# Patient Record
Sex: Female | Born: 1950 | Race: Black or African American | Hispanic: No | Marital: Married | Smoking: Never smoker
Health system: Southern US, Community
[De-identification: ages and names within clinical notes are randomized; demographics above are authoritative.]

## PROBLEM LIST (undated history)

## (undated) DIAGNOSIS — Q282 Arteriovenous malformation of cerebral vessels: Secondary | ICD-10-CM

## (undated) DIAGNOSIS — T7840XA Allergy, unspecified, initial encounter: Secondary | ICD-10-CM

## (undated) DIAGNOSIS — J309 Allergic rhinitis, unspecified: Secondary | ICD-10-CM

## (undated) DIAGNOSIS — F329 Major depressive disorder, single episode, unspecified: Secondary | ICD-10-CM

## (undated) DIAGNOSIS — F32A Depression, unspecified: Secondary | ICD-10-CM

## (undated) DIAGNOSIS — I10 Essential (primary) hypertension: Secondary | ICD-10-CM

## (undated) HISTORY — DX: Major depressive disorder, single episode, unspecified: F32.9

## (undated) HISTORY — DX: Depression, unspecified: F32.A

## (undated) HISTORY — PX: ABDOMINAL HYSTERECTOMY: SHX81

## (undated) HISTORY — DX: Arteriovenous malformation of cerebral vessels: Q28.2

## (undated) HISTORY — PX: BRAIN AVM REPAIR: SHX202

## (undated) HISTORY — DX: Essential (primary) hypertension: I10

## (undated) HISTORY — DX: Allergy, unspecified, initial encounter: T78.40XA

---

## 2000-06-22 ENCOUNTER — Observation Stay (HOSPITAL_COMMUNITY): Admission: EM | Admit: 2000-06-22 | Discharge: 2000-06-23 | Payer: Self-pay | Admitting: Emergency Medicine

## 2000-06-22 ENCOUNTER — Encounter: Payer: Self-pay | Admitting: *Deleted

## 2002-06-20 ENCOUNTER — Inpatient Hospital Stay (HOSPITAL_COMMUNITY): Admission: AD | Admit: 2002-06-20 | Discharge: 2002-06-25 | Payer: Self-pay | Admitting: Family Medicine

## 2002-06-20 ENCOUNTER — Encounter: Payer: Self-pay | Admitting: Internal Medicine

## 2002-06-21 ENCOUNTER — Encounter: Payer: Self-pay | Admitting: Cardiology

## 2002-06-21 ENCOUNTER — Encounter: Payer: Self-pay | Admitting: Internal Medicine

## 2002-06-24 ENCOUNTER — Encounter: Payer: Self-pay | Admitting: Internal Medicine

## 2002-07-25 ENCOUNTER — Encounter: Admission: RE | Admit: 2002-07-25 | Discharge: 2002-10-23 | Payer: Self-pay | Admitting: Family Medicine

## 2002-12-16 ENCOUNTER — Ambulatory Visit (HOSPITAL_COMMUNITY): Admission: RE | Admit: 2002-12-16 | Discharge: 2002-12-16 | Payer: Self-pay | Admitting: Gastroenterology

## 2003-04-29 DIAGNOSIS — Q282 Arteriovenous malformation of cerebral vessels: Secondary | ICD-10-CM

## 2003-04-29 HISTORY — DX: Arteriovenous malformation of cerebral vessels: Q28.2

## 2004-03-12 ENCOUNTER — Ambulatory Visit: Payer: Self-pay | Admitting: Psychology

## 2004-03-12 ENCOUNTER — Encounter: Admission: RE | Admit: 2004-03-12 | Discharge: 2004-06-10 | Payer: Self-pay | Admitting: Neurosurgery

## 2005-07-31 ENCOUNTER — Encounter: Admission: RE | Admit: 2005-07-31 | Discharge: 2005-07-31 | Payer: Self-pay | Admitting: Neurosurgery

## 2006-08-29 ENCOUNTER — Inpatient Hospital Stay (HOSPITAL_COMMUNITY): Admission: EM | Admit: 2006-08-29 | Discharge: 2006-08-30 | Payer: Self-pay | Admitting: *Deleted

## 2008-01-27 ENCOUNTER — Ambulatory Visit (HOSPITAL_COMMUNITY): Payer: Self-pay | Admitting: Psychology

## 2008-02-10 ENCOUNTER — Ambulatory Visit (HOSPITAL_COMMUNITY): Payer: Self-pay | Admitting: Psychology

## 2008-02-29 ENCOUNTER — Ambulatory Visit (HOSPITAL_COMMUNITY): Payer: Self-pay | Admitting: Psychology

## 2009-10-01 ENCOUNTER — Ambulatory Visit: Payer: Self-pay | Admitting: Internal Medicine

## 2010-01-31 ENCOUNTER — Ambulatory Visit: Payer: Self-pay | Admitting: Internal Medicine

## 2010-05-19 ENCOUNTER — Encounter: Payer: Self-pay | Admitting: Neurosurgery

## 2010-07-15 ENCOUNTER — Ambulatory Visit (INDEPENDENT_AMBULATORY_CARE_PROVIDER_SITE_OTHER): Payer: Self-pay | Admitting: Internal Medicine

## 2010-07-15 ENCOUNTER — Ambulatory Visit: Payer: BC Managed Care – PPO | Admitting: Internal Medicine

## 2010-07-15 DIAGNOSIS — M545 Low back pain: Secondary | ICD-10-CM

## 2010-09-13 NOTE — Discharge Summary (Signed)
Kaiser, Melanie            ACCOUNT NO.:  [id number]   MEDICAL RECORD NO.:  [id number]          PATIENT TYPE:  INP   LOCATION:  1405                         FACILITY:  [facility]   PHYSICIAN:  Ladell Pier, M.D.   DATE OF BIRTH:  04/03/1951   DATE OF ADMISSION:  08/29/2006  DATE OF DISCHARGE:                               DISCHARGE SUMMARY   DISCHARGE DIAGNOSES:  1. Chest pain.  Negative cardiac enzymes, normal electrocardiogram.      The patient will be called by [facility] for appointment,      telephone number [phone].  2. Hypertension.  3. Anemia.  4. __________  5. __________ in the past.  6. Insomnia.  7  Hypokalemia.   DISCHARGE MEDICATIONS:  1. Atenolol 100 mg daily.  2. Maxzide 37.5/25 daily.  3. Amitriptyline 10 mg p.o. nightly p.r.n. insomnia.   FOLLOWUP APPOINTMENTS:  Followup with primary care physician in 1 week,  Dr. Wynonia Hazard.  The patient is to follow up with [facility]  [facility]iology, Dr. Lewayne Bunting, [phone].  The patient will be called by  [place] with appointment.   HISTORY OF PRESENT ILLNESS:  The patient is a 60 year old female who  presented to the emergency room secondary to complaint of substernal  chest pain radiating to the left shoulder.  The patient stated that the  pain awakens her out of her sleep.  She received aspirin in the  emergency department.  She denies nausea, vomiting, or dizziness.   PAST MEDICAL HISTORY, SOCIAL HISTORY, ALLERGIES, MEDICATIONS, REVIEW OF  SYSTEMS:  Refer to admission H&P.   PHYSICAL EXAMINATION ON DISCHARGE:  VITAL SIGNS: Temperature 97.9, pulse  68, respirations 20, blood pressure 115/67, pulse oximetry 98% on room  air.  HEENT:  Normocephalic and atraumatic.  Pupils equal, round, and reactive  to light.  Throat without erythema.  CARDIOVASCULAR:  Regular rate and rhythm.  LUNGS:  Clear bilaterally.  ABDOMEN:  Positive bowel sounds.  EXTREMITIES:  No edema.   HOSPITAL COURSE:  #1.  CHEST  PAIN:  The patient was admitted to the hospital and ruled out  with serial enzymes.  Her D-dimer, chest x-ray, and EKG were normal.  The patient discussed with [facility], and since the patient is  low risk and all her enzymes have been negative, she will follow up for  outpatient stress test.  She will be called by cardiology.   #2.  HYPERTENSION:  Blood pressure remained normal throughout her  hospitalization.  Continued her on medications.   #3.  ANEMIA:  The patient states that she does not remember having a  history of anemia.  She will follow up outpatient with Dr. Adonis Housekeeper for  further workup of anemia.   #4.  HYPOKALEMIA:  Most likely secondary to her diuretic.  We will  replete her potassium today, and she will get her potassium level  checked when she follows up with her primary care physician.   LABORATORY DATA:  WBC 5.9, hemoglobin 10.8, platelets 162.  BMP: Sodium  139, potassium 3.3, chloride 104, CO2 28, glucose 102, BUN 7, creatinine  0.8.  Homocysteine level  7.1.  Cardiac markers normal.  D-dimer less  than 0.22.   Chest x-ray:  No acute abnormality.      Ladell Pier, M.D.  Electronically Signed     NJ/MEDQ  D:  08/30/2006  T:  08/30/2006  Job:  [id number]   cc:   Duwayne Heck L. Mahaffey, M.D.  Fax: [phone]   Doylene Canning. Ladona Ridgel, MD  [address]  [address]  [place]  [place] [address]

## 2010-09-13 NOTE — H&P (Signed)
NAME:  HAYDE, KILGOUR            ACCOUNT NO.:  [id number]   MEDICAL RECORD NO.:  [id number]          PATIENT TYPE:  INP   LOCATION:  0104                         FACILITY:  [facility]   PHYSICIAN:  Della Goo, M.D. DATE OF BIRTH:  Dec 09, 1950   DATE OF ADMISSION:  08/29/2006  DATE OF DISCHARGE:                              HISTORY & PHYSICAL   PRIMARY CARE PHYSICIAN:  Unassigned.   CHIEF COMPLAINT:  Chest pain.   HISTORY OF PRESENT ILLNESS:  This is a 60 year old female who presented  to the emergency department secondary to complaints of intermittent  substernal chest pain which radiated into the left shoulder.  The  patient reports being awakened from sleeping in the a.m. due to the  pain.  It persisted and was unrelieved until administration of aspirin  therapy in the emergency department when she presented that evening.  She described the pain as intermittent an achy and rated the intensity  as a 2/10 at the worst.  She denies having any nausea, vomiting,  diaphoresis, syncope, or dizziness associated with the pain.  She also  states that the pain is not reproducible or palpable.   PAST MEDICAL HISTORY:  Hypertension.   PAST SURGICAL HISTORY:  1. Total abdominal hysterectomy.  2. Axillary sebaceous cyst removal.   ALLERGIES:  NO KNOWN DRUG ALLERGIES.   MEDICATIONS:  1. Atenolol 100 mg one p.o. daily.  2. Maxzide 37.5/25 one p.o. daily.  3. Amitriptyline 10 mg, one p.o. q.h.s. p.r.n. insomnia.   SOCIAL HISTORY:  The patient is married.  Nonsmoker, nondrinker.   FAMILY HISTORY:  Both parents had hypertension.  No history of coronary  artery disease, diabetes mellitus, or cancer in her family   REVIEW OF SYSTEMS:  Pertinents are mentioned above.   PHYSICAL EXAMINATION:  GENERAL:  This is a pleasant 60 year old well-  nourished, well-developed female in no acute distress.  VITAL SIGNS:  Temperature 98.1, blood pressure 138/84, heart rate 61,  respirations 20.  O2  saturations 98% on room air.  HEENT:  Normocephalic, atraumatic.  Pupils equally round and reactive to  light.  Extraocular muscles are intact.  Funduscopic benign.  Oropharynx  is clear.  NECK:  Supple, full range of motion.  No thyromegaly, adenopathy, or  jugular venous distension.  CARDIOVASCULAR:  Regular rate and rhythm.  LUNGS:  Clear to auscultation bilaterally.  ABDOMEN:  Positive bowel sounds.  Soft, nontender, nondistended.  EXTREMITIES:  Without edema.  NEUROLOGIC:  Alert and oriented x3.  There are no focal deficits.   LABORATORY STUDIES:  White blood cell count 6.1, hemoglobin 12.1,  hematocrit 35.0, MCV 84.5, platelets 183, neutrophils 49%, lymphocytes  43%.  D-dimer less than 0.22.  Sodium 139, potassium 3.6, chloride 104,  CO2 27, BUN 8, creatinine 0.7, glucose 91.  Cardiac enzymes:  Myoglobin  28.8, CK-MB less than 1.0, and troponin less than 0.05.   EKG reveals a normal sinus rhythm without acute ST-segment changes.   ASSESSMENT:  This is a 60 year old female being admitted with:  1. Chest pain.  2. Hypertension.   PLAN:  The patient will be admitted to a telemetry  area for cardiac  monitoring.  Cardiac enzymes will be performed q.8h., and the patient  will be placed on medical therapy of Nitropaste, aspirin, and oxygen.  GI prophylaxis has also been ordered, and DVT prophylaxis has been  ordered.  She will continue her medications otherwise, and further  cardiac evaluation will be considered either as an inpatient or possibly  as an outpatient depending on the patient's results of her cardiac  profiles.      Della Goo, M.D.  Electronically Signed     HJ/MEDQ  D:  08/30/2006  T:  08/30/2006  Job:  [id number]

## 2010-09-13 NOTE — Discharge Summary (Signed)
NAME:  Melanie Kaiser, Melanie Kaiser                      ACCOUNT NO.:  [id number]   MEDICAL RECORD NO.:  [id number]                   PATIENT TYPE:  INP   LOCATION:  5015                                 FACILITY:  [facility]   PHYSICIAN:  Jackie Plum, M.D.             DATE OF BIRTH:  01-03-1951   DATE OF ADMISSION:  06/20/2002  DATE OF DISCHARGE:  06/25/2002                                 DISCHARGE SUMMARY   PRIMARY CARE PHYSICIAN:  Candyce Churn, M.D.   DISCHARGE DIAGNOSES:  1. Left cerebellar pontine angle hemorrhage.  2. Hypertension.   DISCHARGE MEDICATIONS:  1. Maxzide 25/37.5 mg one daily.  2. Paxil 20 mg daily.  3. Atenolol 100 mg daily.  4. Multivitamins daily.  5. Aspirin 81 mg daily.  6. Calcium carbonate 1200 mg plus vitamin D once twice a day.  7. Menopause complex one a day.  8. Darvocet-N 100 two tablets every four hours as needed for pain.   CONSULTS:  Coletta Memos, M.D., neurosurgery.   PROCEDURES AND STUDIES:  1. Bilateral carotid ultrasound on June 21, 2002.  No RCA stenosis.     Vertebral artery flow was antegrade.  2. Echocardiogram on June 21, 2002.  The overall left ventricular     systolic function was normal.  The left ventricular ejection fraction was     estimated at 55-65%.  No cardiac source of embolism was identified.  3. CT of the head without contrast on June 21, 2002, revealed an 8 x 10     mm acute hemorrhage in the left middle cerebellar peduncle.  This may be     a hypertensive bleed or less a small vascular malformation bleed.  4. MRA and MRI of the brain on June 21, 2002, revealed an abnormality of     the left middle cerebral peduncle at the level of the root entry zone of     the left fifth cranial nerve suspicious for cavernous angioma which may     have recently bled, minimal nonspecific white matter-type changes,     question mild atrophy, and no significant intracranial stenosis     demonstrated as described  above.  5. Cerebral angiogram.  Official report pending at discharge per Dr. Lowry Bowl note on June 24, 2002, angiogram is negative.  6. Repeat CT of the head on June 24, 2002.  Official report also     pending.  Per Dr. Lowry Bowl note on June 24, 2002, the CT is     unchanged.   LABORATORY DATA:  WBC 7.2, RBC 4.19, hemoglobin 12.2, hematocrit 35.8, MCV  85.6.  Sodium 134, potassium 3.7, chloride 98, CO2 26, glucose 109, BUN 10,  creatinine 7.8, calcium 9.8.  Homocystine 11.37.  Total cholesterol 169,  triglycerides 148, HDL 46, LDL 93.  TSH 3.231.   DISPOSITION:  The patient will be discharged home.   CONDITION ON DISCHARGE:  Stable.  HISTORY OF PRESENT ILLNESS:  This is a 60 year old female who presented to  [place] on June 20, 2002, with complaints of  tingling and numbness on the left side of her face.  The patient described  her symptoms as being similar to having Novocaine.  She also reported some  subjective mild expressive aphasia.  The patient reported that she felt she  had to be more deliberate in her speech.  Her blood pressure was taken prior  to presentation by a school nurse and was recorded at 170/118.  The patient  reported being compliant with all of her medications.  The initial  evaluation noted her vital signs to be stable.  The blood pressure was  120/72.  The neurologic exam was nonfocal.  The patient was admitted for  further evaluation and treatment.   HOSPITAL COURSE:  #1 - LEFT CEREBELLAR PONTINE ANGLE HEMORRHAGE:  The  patient was admitted.  A head CT was obtained along with an MRI and MRA with  the results as noted above.  Carotid Dopplers were also performed along with  a lipid panel to assess for risk factors.  Given the results of her MRI and  MRA, a neurosurgery consult was obtained.  She was seen by Coletta Memos,  M.D., on June 22, 2002.  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  Recommendations were not for any operative therapy at  this time considering the location of her hemorrhage.  A cerebral angiogram  was obtained.  The results were pending at discharge, but per Dr. Sueanne Margarita  note prior to discharge, her cerebral angiogram was negative.  Recommendations were to discharge home and to follow up with him on an  outpatient basis.  At discharge, the patient is neurologically intact.  She  is walking independently without any problems.  The patient complained of a  headache the day prior to discharge and a repeat CT was ordered.  At  discharge, the official report was pending, but per Dr. Sueanne Margarita report, it  was unchanged and his recommendations were for discharge home and follow up  with him on an outpatient basis.   #2 - HYPERTENSION:  Her blood pressure was controlled throughout  hospitalization.  She has been discharged on her previous outpatient  medications.   FOLLOW-UP:  The patient should follow up with her primary care physician,  Candyce Churn, M.D., in approximately one week and follow up with  Coletta Memos, M.D., in approximately two weeks.     Stephanie Swaziland, NP                      Jackie Plum, M.D.    SJ/MEDQ  D:  06/25/2002  T:  06/25/2002  Job:  [id number]   cc:   Candyce Churn, M.D.  [address] [place]  [place] [address]  Fax: [phone]   Coletta Memos, M.D.  [address]  [place]  [place] [address]  Fax: [phone]

## 2010-09-13 NOTE — Consult Note (Signed)
[facility]  Patient:    Melanie Kaiser, Melanie Kaiser                   MRN: [id number] Proc. Date: 06/22/00 Adm. Date:  [id number] Disc. Date: [id number] Attending:  Heather Roberts                          Consultation Report  DATE OF BIRTH:  [birth year]  REFERRING PHYSICIAN:  Dr. Heather Roberts.  REASON FOR EVALUATION:  Headache and possible meningitis.  HISTORY OF PRESENT ILLNESS:  See initial inpatient consultation evaluation of this 60 year old female who was in her usual state of good health until the night prior to admission when she subacutely developed a fairly severe headache which was located in the frontal area and was throbbing in character. It was associated with some photophobia and phonophobia but not with nausea or vomiting. The headache was worsened a little bit by lying down but there was no clear relationship to coughing, bending, or straining. This headache was associated with fever of up to 102, neck stiffness and diffuse myalgias. She presented to her primary care physician, Dr. Orson Aloe, today and was febrile and ill appearing. She was subsequently admitted for observation, IV fluids and analgesics. She has received Tylenol and reports that the headache does seem to be better than it was this morning and she does also report that the Tylenol did seem to be helping the headache at home. She specifically denies any associated visual changes, dysarthria, dysphagia, numbness, tingling or paresthesias in the extremities, weakness or bowel or bladder symptoms.  PAST MEDICAL HISTORY:  She had a hysterectomy in 1990 and eye surgery in 1997. She is on antihypertensive agents.  FAMILY HISTORY:  Hypertension in her parents and grandparents, also a history of colon cancer.  SOCIAL HISTORY:  She lives with her husband and is normally independent in her activities of daily living.  ALLERGIES:  No known drug allergies.  MEDICATIONS:  At  home antihypertensive meds, in the hospital IV fluids, Tylenol, Restoril.  PHYSICAL EXAMINATION:  VITAL SIGNS:  Temperature 101, blood pressure 109/74, pulse 64, respirations 20.  GENERAL:  She is alert and does not appear particularly distressed. Her speech and not dysarthric. Mood, ______, and affect appropriate. She is able to follow commands.  NECK:  There is decreased active and passive range of motion and flexion but lateral flexion and rotation are full and relatively painless. ______ pain in the low back with flexion of the neck.  NEUROLOGIC:  Mental status as above. Cranial nerves, funduscopic exam is benign, specifically disks are flat and spontaneous venous pulsations are present. Pupils are equal and briskly reactive. Extraocular movements are normal without nystagmus. Visual fields are full to confrontation. Face, tongue, and palate all move normally. Motor, normal bulk and tone. Normal strength in all tested extremity muscles. Sensation is intact to light touch in all extremities. Cerebellar, finger to nose is normal. Rapid alternating movements are performed well. Reflexes are 2+ and symmetric. Toes are down going. Gait exam is deferred.  LABORATORY DATA:  CBC, white cells 9.2, hemoglobin 12.1, platelets  209,000. CMET is normal except for a mildly elevated total protein and mildly low potassium. Coags are normal. Urinalysis is remarkable only for trace leukocytes with negative microscopic. CT of the head is personally reviewed and is normal.  IMPRESSION:  Viral syndrome with headache, fever, neck pain, and myalgias. Her symptoms are actually improving with conservative  management. She does not have bacterial meningitis. It is possible she has viral meningitis but this entity would not be treated any differently than her viral syndrome.  RECOMMENDATIONS:  1. Conservative treatment with oral analgesics and IV fluids.  2. If she continues to do better will be okay  for discharge. If the     headache recurs would check an LP for a pressure cell count and routine     studies, otherwise see no need to subject her to this procedure. DD:  06/22/00 TD:  06/23/00 Job: [id number]

## 2010-09-13 NOTE — Consult Note (Signed)
NAME:  Melanie Kaiser, BROXTON                      ACCOUNT NO.:  [id number]   MEDICAL RECORD NO.:  [id number]                   PATIENT TYPE:  INP   LOCATION:  5015                                 FACILITY:  [facility]   PHYSICIAN:  Coletta Memos, M.D.                  DATE OF BIRTH:  February 18, 1951   DATE OF CONSULTATION:  06/22/2002  DATE OF DISCHARGE:                                   CONSULTATION   CHIEF COMPLAINT:  Left cerebellar pontine angle hemorrhage.   INDICATIONS:  The patient is a 60 year old who presented to the hospital on  06/20/2002 with history of headache and left facial tingling. She was  admitted to the hospital by her primary care physician and then placed under  the care of the Hospitalists.  CT done at the time of admission showed a  small hemorrhage in the left root entry zone of the trigeminal nerve.  She  did well, had an episode of hypertension while she was actually at her  physician's office.  She has been stable since that time. She has never had  problems with this before.  Her neurologic exam was otherwise normal,  although she thought her speech might have been slurred. She did not have  any significant headache o r the like.  She had a blood pressure recorded at  170/118.  She has never had anything similar to this.   REVIEW OF SYSTEMS:  She has had no dysuria, hematuria, cough, chest pain,  shortness of breath, constitutional, respiratory, gastrointestinal,  genitourinary, cardiovascular, neurologic, psychiatric, endocrine,  hematologic, or allergic problems previously.   PAST MEDICAL HISTORY:  1. Partial hysterectomy.  2. Hypertension.  3. Sebaceous gland infection.   MEDICATIONS ON ADMISSION:  Maxzide, Paxil, atenolol, multivitamins, aspirin,  menopause complex drug, and calcium.   FAMILY HISTORY:  Father had cerebrovascular disease, Alzheimer's,  hypertension.  Mother had cancer and hypertension.  Paternal grandmother  cancer and hypertension.   Maternal grandmother hypertension.  Mother  hypertension.  Sister hypertension.  Maternal grandfather cerebrovascular  disease and hypertension.   SOCIAL HISTORY:  She does not smoke nor does she use any illicit drugs.  Very rare alcohol use.  She works as a Clinical biochemist, is married, and  lives with her husband.  I have taken care of her brother-in-law in the  past.   PHYSICAL EXAMINATION:  VITAL SIGNS:  Temperature 96.9, pulse 69, respiratory  rate 20, blood pressure 112/74.  NEUROLOGIC:  She is alert and oriented x 4 and answering all questions  appropriately.  Memory and language, attention span, fund of knowledge are  normal.  Pupils are equal, round, and reactive to light.  Mild left facial  dysesthesia.  Otherwise normal.  Symmetric facial movements.  Hearing intact  to voice bilaterally.  Shoulder shrug normal.  No jerks on exam. There is  5/5 strength in the upper and lower extremities.  Speech clear and  fluent.  LUNGS:  Clear.  HEART:  Regular rate and rhythm.  No murmurs or rubs.  NECK:  No cervical masses or bruits.   LABORATORY DATA:  CT shows a small hyperdense region consistent with blood  at the root entry  of the left trigeminal nerve.  MRI confirms this.  It has  very much the appearance of a cavernous angioma.   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   The patient has a problem in what is a very delicate area of the brain.  Typically one does not operate on a cavernous angioma after only one  hemorrhage.  The problem here is that a hemorrhage could be devastating for  her.  It is rare if ever that these are fatal hemorrhages only because they  do not bleed at arterial pressure.  They are very low-pressure hemorrhages.  Any operative therapy here would also be fraught with peril only because  there is very little margin for error in the brain stem which is where this  procedure would  be.   What I would like to do is for her to get a cerebral arteriogram to make  sure that this is a cavernous angioma.  Although my inkling is that right  now I think this should probably be followed, that may change with further  information.                                               Coletta Memos, M.D.    KC/MEDQ  D:  06/24/2002  T:  06/24/2002  Job:  [id number]

## 2010-09-13 NOTE — Op Note (Signed)
   NAME:  Melanie Kaiser, Melanie Kaiser                      ACCOUNT NO.:  [id number]   MEDICAL RECORD NO.:  [id number]                   PATIENT TYPE:  AMB   LOCATION:  ENDO                                 FACILITY:  [facility]   PHYSICIAN:  Anselmo Rod, M.D.               DATE OF BIRTH:  14-Nov-1950   DATE OF PROCEDURE:  12/16/2002  DATE OF DISCHARGE:                                 OPERATIVE REPORT   PROCEDURE PERFORMED:  Colonoscopy.   ENDOSCOPIST:  Anselmo Rod, M.D.   INSTRUMENT USED:  Olympus video colonoscope.   INDICATIONS FOR PROCEDURE:  A 60 year old, African-American female, with a  history of guaiac positive stools and a questionable family history of colon  cancer.  Rule out colonic polyps, masses, etc.   PREPROCEDURE PREPARATION:  Informed consent was procured from the patient.  The patient was fasted for eight hours prior to the procedure and prepped  with a bottle of magnesium citrate and a gallon of GoLYTELY the night prior  to the procedure.   PREPROCEDURE PHYSICAL EXAMINATION:  VITAL SIGNS:  The patient with stable  vital signs.  NECK:  Supple.  CHEST:  Clear to auscultation.  CARDIAC:  S1, S2, regular.  ABDOMEN:  Soft with normal bowel sounds.   DESCRIPTION OF PROCEDURE:  The patient was placed in the left lateral  decubitus position, sedated with 50 mg of Demerol and 6 mg of Versed  intravenously.  Once the patient was adequately sedated and maintained on  low-flow oxygen and continuous cardiac monitoring, the Olympus video  colonoscope was advanced from the rectum to the cecum and terminal ileum  without difficulty.  The patient had a fairly good prep.  No masses, polyps,  erosions, ulcerations, or diverticulosis seen.  Small internal hemorrhoids  were appreciated on retroflexion in the rectum.  The patient tolerated the  procedure well without complications.   IMPRESSION:  Normal colonoscopy up to the terminal ileum, except for a small  internal  hemorrhoid.   RECOMMENDATIONS:  1. A high fiber diet with liberal fluid intake has been advocated.  2.     Repeat guaiac testing should be done on an outpatient basis.  3. Repeat colorectal screening in the next five years.  4. Outpatient follow-up in the next two weeks or earlier if need be.                                               Anselmo Rod, M.D.    JNM/MEDQ  D:  12/16/2002  T:  12/16/2002  Job:  [id number]   cc:   Duwayne Heck L. Mahaffey, M.D.  [address]  [place]  [place] [address]  Fax: [phone]

## 2010-09-13 NOTE — Discharge Summary (Signed)
[facility]  Patient:    Melanie Kaiser, Melanie Kaiser                   MRN: [id number] Adm. Date:  [id number] Disc. Date: [id number] Attending:  Heather Roberts CC:         Kelli Hope, M.D.   Discharge Summary  DISCHARGE DIAGNOSES: 1. Viral syndrome, possible viral meningitis improving. 2. Hypertension. 3. Hormone replacement therapy for menopause.  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  For PMH, family history, review of systems, and physical exam, see dictated history and physical.  LABORATORY DATA:  CT unenhanced of the brain showed mild atrophy otherwise normal. Admission CBC showed white count of 9.2, hemoglobin 12, platelets 209. Chemistries, potassium 3.3, total protein 8.2, otherwise normal. Urinalysis scant urobilinogen.  HOSPITAL COURSE:  Melanie Kaiser was admitted with a severe headache and stiff neck for observation and consideration for a spinal tap. She was seen in consultation by Dr. Thad Ranger after her CT who felt that a spinal tap was not indicated and that even if she had meningitis it was viral and she was improving.  By the morning of discharge, she was tolerating a liquid diet well and had persistent headache and some stiff neck but was markedly better.  She is discharged in satisfactory condition for return to work 48-72 hours after discharge on Tylenol and Tenoretic 100 and Estradiol as prescribed by her gynecologist. DD:  06/23/00 TD:  06/23/00 Job: [id number]

## 2010-09-13 NOTE — H&P (Signed)
[facility]  Patient:    Melanie Kaiser, Melanie Kaiser                     MRN: [id number] Adm. Date:  06/22/00 Attending:  Heather Roberts, M.D. CC:         Kelli Hope, M.D.   History and Physical  SOCIAL SECURITY NUMBER:  [id number].  DATE OF BIRTH:  1950/09/22  CHIEF COMPLAINT:  Severe headache and fever.  HISTORY OF PRESENT ILLNESS:  [age]-year-old black married female who felt well until the day prior to admission when she developed the onset of a fever and severe headache with neck rigidity.  It became worse in the several hours prior to admission.  She denies congestion, cough or sore throat.  SOCIAL HISTORY:  Consulting civil engineer.  Lives with her husband and three grown children.  PAST MEDICAL HISTORY:  November 1997, chalazion, upper lid, left eye, removed by Dr. Marya Landry. Carlyle Lipa., [facility].  October 1990, exploratory laparotomy and TAH, Dr. Katherine Roan, [facility], for menorrhagia and fibroids.  October 1978, excision of right axillary hidradenitis suppurativa, Dr. Margarito Liner, Department of Plastic Surgery, [facility] in [place].  BTL, 1980.  She is G3, P3 with vaginal deliveries, 1972, 10 and 1980.  FAMILY HISTORY:  Parents have hypertension; grandparents -- CHF.  There is a history of colon cancer.  REVIEW OF SYSTEMS:  HEENT:  Please see above.  CARDIAC:  Has hypertension, on Tenoretic.  Had chest discomfort with negative ETT, Dr. Peter M. [place], in 1996, resolved.  PULMONARY:  No current cough or congestion.  GI:  No indigestion or constipation.  PSYCHIATRIC:  History of depression.  GYN:  She is on estradiol for menopause from Dr. Elana Alm.  PHYSICAL EXAMINATION  GENERAL:  Physical exam reveals a moderately ill black female who is lying on the bed.  VITAL SIGNS:  Temperature 101.3, blood pressure 120/86, pulse 76.  HEENT:  Fundi grossly normal.  Pharynx not injected.  Tympanic  membranes negative.  NECK:  Carotids 2+.  No bruits.  No thyromegaly.  She is able to turn her head some to the side but has pain with any forward flexion and is tender in the upper trapezius.  BREASTS:  Normal.  CARDIAC:  Normal S1 and S2.  LUNGS:  Clear to A&P.  ABDOMEN:  Soft without HSM.  PELVIC AND RECTAL:  Deferred, recently done by gynecologist.  LABORATORY DATA:  CBC shows WBC count of 7.7, hemoglobin 11.3, platelets 189,000.  IMPRESSION:  Viral illness, rule out meningitis.  PLAN:  Admission, neurologic consult, unenhanced CT now. DD:  06/22/00 TD:  06/23/00 Job: [id number]

## 2010-10-04 ENCOUNTER — Other Ambulatory Visit: Payer: BC Managed Care – PPO

## 2010-10-07 ENCOUNTER — Encounter: Payer: BC Managed Care – PPO | Admitting: Internal Medicine

## 2010-11-06 ENCOUNTER — Other Ambulatory Visit: Payer: Self-pay | Admitting: Internal Medicine

## 2010-11-06 DIAGNOSIS — I10 Essential (primary) hypertension: Secondary | ICD-10-CM

## 2011-01-02 ENCOUNTER — Other Ambulatory Visit: Payer: Self-pay | Admitting: Internal Medicine

## 2011-01-22 ENCOUNTER — Other Ambulatory Visit: Payer: Self-pay

## 2011-01-22 MED ORDER — GLUCOSE BLOOD VI STRP: ORAL_STRIP | Status: AC

## 2011-02-18 ENCOUNTER — Other Ambulatory Visit: Payer: Self-pay | Admitting: Internal Medicine

## 2011-04-23 ENCOUNTER — Other Ambulatory Visit: Payer: Self-pay | Admitting: Internal Medicine

## 2012-12-31 ENCOUNTER — Encounter (HOSPITAL_COMMUNITY): Payer: Self-pay | Admitting: Emergency Medicine

## 2012-12-31 ENCOUNTER — Emergency Department (HOSPITAL_COMMUNITY)
Admission: EM | Admit: 2012-12-31 | Discharge: 2012-12-31 | Disposition: A | Discharge status: Home / Self Care | Payer: 59 | Source: Home / Self Care

## 2012-12-31 DIAGNOSIS — L242 Irritant contact dermatitis due to solvents: Secondary | ICD-10-CM

## 2012-12-31 MED ORDER — FLUTICASONE PROPIONATE 0.05 % EX CREA: TOPICAL_CREAM | Freq: Two times a day (BID) | CUTANEOUS | Status: DC

## 2012-12-31 NOTE — ED Provider Notes (Signed)
CSN: [id number]     Arrival date & time 12/31/12  1151 History   None    Chief Complaint  Patient presents with  . Nail Problem   (Consider location/radiation/quality/duration/timing/severity/associated sxs/prior Treatment) Patient is a 62 y.o. female presenting with rash. The history is provided by the patient.  Rash Pain severity:  No pain Onset quality:  Gradual Duration:  4 weeks Progression:  Unchanged Chronicity:  New Context comment:  Fingernail/ cuticle rash mostly on left hand. Relieved by:  None tried Worsened by:  Nothing tried Risk factors comment:  Possibly from new nail salon visit.   Past Medical History  Diagnosis Date  . Hypertension   . Depression   . Allergy allergic    allergic rhinitis  . Diabetes mellitus   . 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 Abdominal hysterectomy      vaginal  . Brain avm repair     Family History  Problem Relation Age of Onset  . Mental illness Father   . Stroke Father    History  Substance Use Topics  . Smoking status: Never Smoker   . Smokeless tobacco: Never Used  . Alcohol Use: No   OB History   Grav Para Term Preterm Abortions TAB SAB Ect Mult Living                 Review of Systems  Constitutional: Negative.   Skin: Positive for rash.    Allergies  Review of patient's allergies indicates no known allergies.  Home Medications   Current Outpatient Rx  Name  Route  Sig  Dispense  Refill  . aspirin 81 MG EC tablet   Oral   Take 81 mg by mouth daily.           Marland Kitchen atenolol (TENORMIN) 100 MG tablet      TAKE ONE TABLET BY MOUTH EVERY DAY   30 tablet   11   . calcium citrate-vitamin D (CITRACAL+D) 315-200 MG-UNIT per tablet   Oral   Take 1 tablet by mouth 2 (two) times daily.           . fluticasone (CUTIVATE) 0.05 % cream   Topical   Apply topically 2 (two) times daily.   30 g   0   . fluticasone (FLONASE) 50  MCG/ACT nasal spray   Nasal   Place 2 sprays into the nose daily.           . sertraline (ZOLOFT) 50 MG tablet      TAKE ONE TABLET BY MOUTH EVERY DAY *NEED OFFICE VISIT*   50 tablet   5   . triamterene-hydrochlorothiazide (MAXZIDE-25) 37.5-25 MG per tablet      TAKE ONE TABLET BY MOUTH EVERY DAY   30 tablet   11    BP 141/90  Pulse 64  Temp(Src) 98.6 F (37 C) (Oral)  Resp 20  SpO2 95% Physical Exam  Nursing note and vitals reviewed. Constitutional: She is oriented to person, place, and time. She appears well-developed and well-nourished.  Neurological: She is alert and oriented to person, place, and time.  Skin: Skin is warm and dry. Rash noted.  Tender peeling sts of cuticle skin of fingers left > right. No drainage or erythema, there is nail deformity developing.    ED Course  Procedures (including critical care time) Labs Review Labs Reviewed - No data to display Imaging Review No  results found.  MDM   1. Dermatitis due to solvents        Linna Hoff, MD 12/31/12 1335

## 2012-12-31 NOTE — ED Notes (Signed)
Reports burning, swelling, and peeling of nails onset 2 weeks ago.

## 2013-01-03 ENCOUNTER — Telehealth (HOSPITAL_COMMUNITY): Payer: Self-pay | Admitting: *Deleted

## 2013-01-03 NOTE — ED Notes (Signed)
Pt.'s husband called and said his wife lost her d/c instructions and Rx.  Discussed with Dr. Artis Flock and approved  calling Rx of Cutivate 0.05 % cream to her pharmacy.  I told her husband I would call it in now and it should be ready in an hour.  He wants Rx. called to the [facility] on [address] [facility] [address] [facility] as written 9/5.  Rx. called to voicemail [phone]. Vassie Moselle 01/03/2013

## 2017-04-03 DIAGNOSIS — F02818 Dementia in other diseases classified elsewhere, unspecified severity, with other behavioral disturbance: Secondary | ICD-10-CM | POA: Diagnosis present

## 2017-04-03 DIAGNOSIS — M109 Gout, unspecified: Secondary | ICD-10-CM | POA: Insufficient documentation

## 2017-12-04 ENCOUNTER — Emergency Department (HOSPITAL_COMMUNITY)
Admission: EM | Admit: 2017-12-04 | Discharge: 2017-12-04 | Disposition: A | Discharge status: Home / Self Care | Payer: Medicare Other | Attending: Emergency Medicine | Admitting: Emergency Medicine

## 2017-12-04 ENCOUNTER — Encounter (HOSPITAL_COMMUNITY): Payer: Self-pay | Admitting: Emergency Medicine

## 2017-12-04 ENCOUNTER — Emergency Department (HOSPITAL_COMMUNITY): Payer: Medicare Other

## 2017-12-04 ENCOUNTER — Other Ambulatory Visit: Payer: Self-pay

## 2017-12-04 DIAGNOSIS — E119 Type 2 diabetes mellitus without complications: Secondary | ICD-10-CM | POA: Insufficient documentation

## 2017-12-04 DIAGNOSIS — F0390 Unspecified dementia without behavioral disturbance: Secondary | ICD-10-CM | POA: Insufficient documentation

## 2017-12-04 DIAGNOSIS — R443 Hallucinations, unspecified: Secondary | ICD-10-CM | POA: Insufficient documentation

## 2017-12-04 DIAGNOSIS — Z79899 Other long term (current) drug therapy: Secondary | ICD-10-CM | POA: Diagnosis not present

## 2017-12-04 DIAGNOSIS — I10 Essential (primary) hypertension: Secondary | ICD-10-CM | POA: Insufficient documentation

## 2017-12-04 LAB — COMPREHENSIVE METABOLIC PANEL
ALK PHOS: 65 U/L (ref 38–126)
ALT: 15 U/L (ref 0–44)
AST: 24 U/L (ref 15–41)
Albumin: 4.6 g/dL (ref 3.5–5.0)
Anion gap: 11 (ref 5–15)
BUN: 10 mg/dL (ref 8–23)
CALCIUM: 9.7 mg/dL (ref 8.9–10.3)
CO2: 26 mmol/L (ref 22–32)
Chloride: 104 mmol/L (ref 98–111)
Creatinine, Ser: 0.69 mg/dL (ref 0.44–1.00)
GFR calc non Af Amer: >60 mL/min (ref >60)
Glucose, Bld: 97 mg/dL (ref 70–99)
POTASSIUM: 3.5 mmol/L (ref 3.5–5.1)
SODIUM: 141 mmol/L (ref 135–145)
TOTAL PROTEIN: 7.7 g/dL (ref 6.5–8.1)
Total Bilirubin: 0.7 mg/dL (ref 0.3–1.2)

## 2017-12-04 LAB — BLOOD GAS, VENOUS
ACID-BASE EXCESS: 3.8 mmol/L — AB (ref 0.0–2.0)
BICARBONATE: 28.1 mmol/L — AB (ref 20.0–28.0)
O2 Saturation: 80.7 %
PCO2 VEN: 43.5 mmHg — AB (ref 44.0–60.0)
Patient temperature: 98.6
pH, Ven: 7.427 (ref 7.250–7.430)
pO2, Ven: 48 mmHg — ABNORMAL HIGH (ref 32.0–45.0)

## 2017-12-04 LAB — CBC WITH DIFFERENTIAL/PLATELET
BASOS ABS: 0 10*3/uL (ref 0.0–0.1)
BASOS PCT: 0 %
Eosinophils Absolute: 0.1 10*3/uL (ref 0.0–0.7)
Eosinophils Relative: 1 %
HEMATOCRIT: 37.1 % (ref 36.0–46.0)
Hemoglobin: 12.3 g/dL (ref 12.0–15.0)
LYMPHS PCT: 28 %
Lymphs Abs: 2.2 10*3/uL (ref 0.7–4.0)
MCH: 29.1 pg (ref 26.0–34.0)
MCHC: 33.2 g/dL (ref 30.0–36.0)
MCV: 87.7 fL (ref 78.0–100.0)
MONOS PCT: 5 %
Monocytes Absolute: 0.4 10*3/uL (ref 0.1–1.0)
NEUTROS PCT: 66 %
Neutro Abs: 5.1 10*3/uL (ref 1.7–7.7)
Platelets: 213 10*3/uL (ref 150–400)
RBC: 4.23 MIL/uL (ref 3.87–5.11)
RDW: 14.3 % (ref 11.5–15.5)
WBC: 7.9 10*3/uL (ref 4.0–10.5)

## 2017-12-04 NOTE — ED Triage Notes (Signed)
Pt sent by PCP to ED for further evaluation. Pt having worsening auditory and visual hallucinations for months but has gotten worse the past 2 weeks per husband.  Pt has dementia. Pt had UA done at doctor's office today which was negative for infection.  Pt had increase in Seroquel on 7/22.

## 2017-12-04 NOTE — ED Notes (Addendum)
Pt being sent by PCP d/t increasing hallucinations.  Hx of dementia.  UA collected and resulted as normal.

## 2017-12-04 NOTE — ED Notes (Signed)
Clothes placed in locker 29. Underwear, pants, shirt, sweater, and shoes.

## 2017-12-04 NOTE — ED Notes (Signed)
Bed: WA29 Expected date:  Expected time:  Means of arrival:  Comments: Hold for triage.

## 2017-12-04 NOTE — Discharge Instructions (Addendum)
Continue your usual medication.   See your medical providers as scheduled and as needed.

## 2017-12-04 NOTE — ED Notes (Signed)
Daughter Adaline SillShelley Polack - [phone]  (call first) Son Sabino NiemannJustin Gavigan [phone]

## 2017-12-04 NOTE — ED Provider Notes (Signed)
[facility] DEPT Provider Note   CSN: [id number] Arrival date & time: 12/04/17  1401     History   Chief Complaint Chief Complaint  Patient presents with  . Hallucinations    HPI Melanie Kaiser is a 67 y.o. female.  HPI   Patient is here for increasing hallucinations, with her husband who gives all the history.  She went to see her PCP today, was seen by another provider at the office, and sent here for further testing with concern for possible recurrence of intracranial bleeding.  She previously had altered mental status, when she had a bleed in 2004 which required craniotomy.  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  She saw her neurologist about a week ago and was started on melatonin and Remeron at that time.  She is primarily bothered by memory disorder.  She has been recently confirmed to have dementia.  There is been no recent fever, chills, cough, shortness of breath, nausea or vomiting.  There are no other known modifying factors.  Past Medical History:  Diagnosis Date  . Allergy allergic   allergic rhinitis  . 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 Depression   . Diabetes mellitus   . Hypertension     There are no active problems to display for this patient.   Past Surgical History:  Procedure Laterality Date  . ABDOMINAL HYSTERECTOMY     vaginal  . BRAIN AVM REPAIR       OB History   None      Home Medications    Prior to Admission medications   Medication Sig Start Date End Date Taking? Authorizing Provider  allopurinol (ZYLOPRIM) 100 MG tablet Take 100 mg by mouth daily. 09/17/17  Yes [provider]  B Complex Vitamins (VITAMIN B COMPLEX) TABS Take 1 tablet by mouth daily.   Yes [provider]  Melatonin 5 MG TABS Take 1 tablet by mouth at bedtime.   Yes [provider]    metoprolol succinate (TOPROL-XL) 25 MG 24 hr tablet Take 25 mg by mouth daily. 09/17/17  Yes [provider]  mirtazapine (REMERON) 7.5 MG tablet Take 7.5 mg by mouth at bedtime.  11/27/17  Yes [provider]  Multiple Vitamins-Minerals (MULTIVITAMIN ADULT) TABS Take 1 tablet by mouth daily.   Yes [provider]  NAMZARIC 28-10 MG CP24 Take 1 capsule by mouth daily. 09/17/17  Yes [provider]  sertraline (ZOLOFT) 100 MG tablet Take 150 mg by mouth daily.  09/17/17  Yes [provider]  triamterene-hydrochlorothiazide (MAXZIDE-25) 37.5-25 MG per tablet TAKE ONE TABLET BY MOUTH EVERY DAY 01/02/11  Yes Baxley, Cresenciano Lick, MD  atenolol (TENORMIN) 100 MG tablet TAKE ONE TABLET BY MOUTH EVERY DAY Patient not taking: Reported on 12/04/2017 11/06/10   Elby Showers, MD  fluticasone (CUTIVATE) 0.05 % cream Apply topically 2 (two) times daily. Patient not taking: Reported on 12/04/2017 12/31/12   Billy Fischer, MD  sertraline (ZOLOFT) 50 MG tablet TAKE ONE TABLET BY MOUTH EVERY DAY *NEED OFFICE VISIT* Patient not taking: Reported on 12/04/2017 04/23/11   Elby Showers, MD    Family History Family History  Problem Relation Age of Onset  . Mental illness Father   . Stroke Father     Social History Social History   Tobacco Use  . Smoking status: Never Smoker  .  Smokeless tobacco: Never Used  Substance Use Topics  . Alcohol use: No  . Drug use: Not on file     Allergies   Patient has no known allergies.   Review of Systems Review of Systems  All other systems reviewed and are negative.    Physical Exam Updated Vital Signs BP 129/66 (BP Location: Left Arm)   Pulse 67   Temp 98 F (36.7 C) (Oral)   Resp 20   Ht 5' 3"  (1.6 m)   Wt 71 kg   SpO2 97%   BMI 27.71 kg/m   Physical Exam  Constitutional: She appears well-developed. No distress.  Elderly, frail  HENT:  Head: Normocephalic and atraumatic.  Eyes: Pupils are equal, round, and  reactive to light. Conjunctivae and EOM are normal.  Neck: Normal range of motion and phonation normal. Neck supple.  Cardiovascular: Normal rate.  Pulmonary/Chest: Effort normal.  Musculoskeletal: Normal range of motion.  Neurological: She is alert. She exhibits normal muscle tone.  Skin: Skin is warm and dry.  Psychiatric: She has a normal mood and affect. Her behavior is normal.  She does not appear to be responding to internal stimuli.  Nursing note and vitals reviewed.    ED Treatments / Results  Labs (all labs ordered are listed, but only abnormal results are displayed) Labs Reviewed  BLOOD GAS, VENOUS - Abnormal; Notable for the following components:      Result Value   pCO2, Ven 43.5 (*)    pO2, Ven 48.0 (*)    Bicarbonate 28.1 (*)    Acid-Base Excess 3.8 (*)    All other components within normal limits  COMPREHENSIVE METABOLIC PANEL  CBC WITH DIFFERENTIAL/PLATELET    EKG EKG Interpretation  Date/Time:  Friday December 04 2017 17:13:35 EDT Ventricular Rate:  66 PR Interval:  148 QRS Duration: 82 QT Interval:  428 QTC Calculation: 448 R Axis:   32 Text Interpretation:  Normal sinus rhythm Normal ECG since last tracing no significant change Confirmed by Daleen Bo [phone]) on 12/04/2017 5:31:46 PM   Radiology Ct Head Wo Contrast  Result Date: 12/04/2017 CLINICAL DATA:  67 year old female with worsening auditory and visual hallucination for several months. EXAM: CT HEAD WITHOUT CONTRAST TECHNIQUE: Contiguous axial images were obtained from the base of the skull through the vertex without intravenous contrast. COMPARISON:  Brain MRI 07/31/2005. FINDINGS: Brain: Mild cerebral and cerebellar atrophy. Patchy and confluent areas of decreased attenuation are noted throughout the deep and periventricular white matter of the cerebral hemispheres bilaterally, compatible with chronic microvascular ischemic disease. No evidence of acute infarction, hemorrhage, hydrocephalus,  extra-axial collection or mass lesion/mass effect. Vascular: No hyperdense vessel or unexpected calcification. Skull: Normal. Negative for fracture or focal lesion. Sinuses/Orbits: No acute finding. Other: None. IMPRESSION: 1. No acute intracranial abnormalities. 2. Mild cerebral and cerebellar atrophy with chronic microvascular ischemic changes in the cerebral white matter. Electronically Signed   By: Vinnie Langton M.D.   On: 12/04/2017 17:14    Procedures Procedures (including critical care time)  Medications Ordered in ED Medications - No data to display   Initial Impression / Assessment and Plan / ED Course  I have reviewed the triage vital signs and the nursing notes.  Pertinent labs & imaging results that were available during my care of the patient were reviewed by me and considered in my medical decision making (see chart for details).  Clinical Course as of Dec 04 2309  Fri Dec 04, 2017  1914 Venous gas,  normal except PCO2 low, PO2 high, bicarb high, acid-base elevated  Blood gas, venous(!) [EW]  1915 Normal  Comprehensive metabolic panel [EW]  7782 Normal  CBC with Differential [EW]  1915 No bleeding or mass lesion appreciated, images reviewed by me  CT Head Wo Contrast [EW]    Clinical Course User Index [EW] Daleen Bo, MD     Patient Vitals for the past 24 hrs:  BP Temp Temp src Pulse Resp SpO2 Height Weight  12/04/17 1940 129/66 98 F (36.7 C) Oral 67 20 97 % - -  12/04/17 1412 (!) 156/75 98.4 F (36.9 C) Oral 69 14 98 % 5' 3"  (1.6 m) 71 kg    7:24 PM Reevaluation with update and discussion. After initial assessment and treatment, an updated evaluation reveals alert, calm and interactive. Met with daughter and husband to discuss care at bedside. Findings discussed and questions answered. Daleen Bo   Medical Decision Making: Stable chronic progressive dementia.   CRITICAL CARE- No Performed by: Daleen Bo   Nursing Notes Reviewed/ Care  Coordinated Applicable Imaging Reviewed Interpretation of Laboratory Data incorporated into ED treatment  The patient appears reasonably screened and/or stabilized for discharge and I doubt any other medical condition or other [facility] requiring further screening, evaluation, or treatment in the ED at this time prior to discharge.  Plan: Home Medications- continue usual; Home Treatments- rest, fluids; return here if the recommended treatment, does not improve the symptoms; Recommended follow up- Providers as scheduled.     Final Clinical Impressions(s) / ED Diagnoses   Final diagnoses:  Hallucinations  Dementia without behavioral disturbance, unspecified dementia type    ED Discharge Orders    None       Daleen Bo, MD 12/04/17 2312

## 2017-12-04 NOTE — ED Notes (Signed)
Patient in room 29 with husband changing into paper scrubs.

## 2018-10-06 ENCOUNTER — Other Ambulatory Visit: Payer: Self-pay | Admitting: Pediatric Intensive Care

## 2018-10-06 DIAGNOSIS — Z20822 Contact with and (suspected) exposure to covid-19: Secondary | ICD-10-CM

## 2018-10-08 LAB — NOVEL CORONAVIRUS, NAA: SARS-CoV-2, NAA: NOT DETECTED

## 2018-10-22 DIAGNOSIS — E119 Type 2 diabetes mellitus without complications: Secondary | ICD-10-CM

## 2019-01-12 ENCOUNTER — Encounter: Payer: Self-pay | Admitting: Obstetrics & Gynecology

## 2019-01-12 ENCOUNTER — Other Ambulatory Visit: Payer: Self-pay

## 2019-01-12 ENCOUNTER — Ambulatory Visit (INDEPENDENT_AMBULATORY_CARE_PROVIDER_SITE_OTHER): Payer: Medicare Other | Admitting: Obstetrics & Gynecology

## 2019-01-12 ENCOUNTER — Telehealth: Payer: Self-pay

## 2019-01-12 VITALS — BP 117/75 | HR 67 | Ht 63.0 in | Wt 173.1 lb

## 2019-01-12 DIAGNOSIS — A630 Anogenital (venereal) warts: Secondary | ICD-10-CM | POA: Diagnosis not present

## 2019-01-12 DIAGNOSIS — N952 Postmenopausal atrophic vaginitis: Secondary | ICD-10-CM

## 2019-01-12 NOTE — Progress Notes (Signed)
Pt states she has been having some burning in her vaginal area for over a year.

## 2019-01-12 NOTE — Patient Instructions (Signed)
Genital Warts Genital warts are a common STD (sexually transmitted disease). They may appear as small bumps on the skin of the genital and anal areas. They sometimes become irritated and cause pain. Genital warts are easily passed to other people through sexual contact. Many people do not know that they are infected. They may be infected for years without symptoms. However, even if they do not have symptoms, they can pass the infection to their sexual partners. Getting treatment is important because genital warts can lead to other problems. In females, the virus that causes genital warts may increase the risk for cervical cancer. What are the causes? This condition is caused by a virus that is called human papillomavirus (HPV). HPV is spread by having unprotected sex with an infected person. It can be spread through vaginal, anal, and oral sex. What increases the risk? You are more likely to develop this condition if:  You have unprotected sex.  You have multiple sexual partners.  You are sexually active before age 30.  You are a man who isnot circumcised.  You have a female sexual partner who is not circumcised.  You have a weakened body defense system (immune system) due to disease or medicine.  You smoke. What are the signs or symptoms? Symptoms of this condition include:  Small growths in the genital area or anal area. These warts often grow in clusters.  Itching and irritation in the genital area or anal area.  Bleeding from the warts.  Pain during sex. How is this diagnosed? This condition is diagnosed based on:  Your symptoms.  A physical exam. You may also have other tests, including:  Biopsy. A tissue sample is removed so it can be checked under a microscope.  Colposcopy. In females, a magnifying tool is used to examine the vagina and cervix. Certain solutions may be used to make the HPV cells change color so they can be seen more easily.  A Pap test in  females.  Tests for other STDs. How is this treated? This condition may be treated with:  Medicines, such as: ? Solutions or creams applied to your skin (topical).  Procedures, such as: ? Freezing the warts with liquid nitrogen (cryotherapy). ? Burning the warts with a laser or electric probe (electrocautery). ? Surgery to remove the warts. Follow these instructions at home: Medicines   Apply over-the-counter and prescription medicines only as told by your health care provider.  Do not treat genital warts with medicines that are used for treating hand warts.  Talk with your health care provider about using over-the-counter anti-itch creams. Instructions for women  Get screened regularly for cervical cancer. Women who have genital warts are at an increased risk for this cancer. This type of cancer is slow growing and can be cured if it is found early.  If you become pregnant, tell your health care provider that you have had an HPV infection. Your health care provider will monitor you closely during pregnancy to be sure that your baby is safe. General instructions  Do not touch or scratch the warts.  Do not have sex until your treatment has been completed.  Tell your current and past sexual partners about your condition because they may also need treatment.  After treatment, use condoms during sex to prevent future infections.  Keep all follow-up visits as told by your health care provider. This is important. How is this prevented? Talk with your health care provider about getting the HPV vaccine. The vaccine:  Can prevent some HPV infections and cancers.  Is recommended for males and females who are 5811-642 years old.  Is not recommended for pregnant women.  Will not work if you already have HPV. Contact a health care provider if you:  Have redness, swelling, or pain in the area of the treated skin.  Have a fever.  Feel generally ill.  Feel lumps in and around  your genital or anal area.  Have bleeding in your genital or anal area.  Have pain during sex. Summary  Genital warts are a common STD (sexually transmitted disease). It may appear as small bumps on the genital and anal areas.  This condition is caused by a virus that is called human papillomavirus (HPV). HPV is spread by having unprotected sex with an infected person. It can be spread through vaginal, anal, and oral sex.  Treatment is important because genital warts can lead to other problems. In females, the virus that causes genital warts may increase the risk for cervical cancer.  This condition may be treated with medicine that is applied to the skin, or procedures to remove the warts.  The HPV vaccine can prevent some HPV infections and cancers. It is recommended that the vaccine be given to males and females who are 5211-242 years old. This information is not intended to replace advice given to you by your health care provider. Make sure you discuss any questions you have with your health care provider. Document Released: 04/11/2000 Document Revised: 05/19/2017 Document Reviewed: 05/19/2017 Elsevier Patient Education  2020 [employer] Atrophic Vaginitis  Atrophic vaginitis is a condition in which the tissues that line the vagina become dry and thin. This condition is most common in women who have stopped having regular menstrual periods (are in menopause). This usually starts when a woman is 7645-486 years old. That is the time when a woman's estrogen levels begin to drop (decrease). Estrogen is a female hormone. It helps to keep the tissues of the vagina moist. It stimulates the vagina to produce a clear fluid that lubricates the vagina for sexual intercourse. This fluid also protects the vagina from infection. Lack of estrogen can cause the lining of the vagina to get thinner and dryer. The vagina may also shrink in size. It may become less elastic. Atrophic vaginitis tends to get worse  over time as a woman's estrogen level drops. What are the causes? This condition is caused by the normal drop in estrogen that happens around the time of menopause. What increases the risk? Certain conditions or situations may lower a woman's estrogen level, leading to a higher risk for atrophic vaginitis. You are more likely to develop this condition if:  You are taking medicines that block estrogen.  You have had your ovaries removed.  You are being treated for cancer with X-ray (radiation) or medicines (chemotherapy).  You have given birth or are breastfeeding.  You are older than age 70.  You smoke. What are the signs or symptoms? Symptoms of this condition include:  Pain, soreness, or bleeding during sexual intercourse (dyspareunia).  Vaginal burning, irritation, or itching.  Pain or bleeding when a speculum is used in a vaginal exam (pelvic exam).  Having burning pain when passing urine.  Vaginal discharge that is brown or yellow. In some cases, there are no symptoms. How is this diagnosed? This condition is diagnosed by taking a medical history and doing a physical exam. This will include a pelvic exam that checks the vaginal tissues. Though rare,  you may also have other tests, including:  A urine test.  A test that checks the acid balance in your vagina (acid balance test). How is this treated? Treatment for this condition depends on how severe your symptoms are. Treatment may include:  Using an over-the-counter vaginal lubricant before sex.  Using a long-acting vaginal moisturizer.  Using low-dose vaginal estrogen for moderate to severe symptoms that do not respond to other treatments. Options include creams, tablets, and inserts (vaginal rings). Before you use a vaginal estrogen, tell your health care provider if you have a history of: ? Breast cancer. ? Endometrial cancer. ? Blood clots. If you are not sexually active and your symptoms are very mild, you may  not need treatment. Follow these instructions at home: Medicines  Take over-the-counter and prescription medicines only as told by your health care provider. Do not use herbal or alternative medicines unless your health care provider says that you can.  Use over-the-counter creams, lubricants, or moisturizers for dryness only as directed by your health care provider. General instructions  If your atrophic vaginitis is caused by menopause, discuss all of your menopause symptoms and treatment options with your health care provider.  Do not douche.  Do not use products that can make your vagina dry. These include: ? Scented feminine sprays. ? Scented tampons. ? Scented soaps.  Vaginal intercourse can help to improve blood flow and elasticity of vaginal tissue. If it hurts to have sex, try using a lubricant or moisturizer just before having intercourse. Contact a health care provider if:  Your discharge looks different than normal.  Your vagina has an unusual smell.  You have new symptoms.  Your symptoms do not improve with treatment.  Your symptoms get worse. Summary  Atrophic vaginitis is a condition in which the tissues that line the vagina become dry and thin. It is most common in women who have stopped having regular menstrual periods (are in menopause).  Treatment options include using vaginal lubricants and low-dose vaginal estrogen.  Contact a health care provider if your vagina has an unusual smell, or if your symptoms get worse or do not improve after treatment. This information is not intended to replace advice given to you by your health care provider. Make sure you discuss any questions you have with your health care provider. Document Released: 08/29/2014 Document Revised: 03/27/2017 Document Reviewed: 01/08/2017 Elsevier Patient Education  2020 [employer].

## 2019-01-12 NOTE — Telephone Encounter (Signed)
Pt's husband called stating he forgot to mention that  pt has had a vaginal odor for a while.

## 2019-01-12 NOTE — Progress Notes (Signed)
History:  68 y.o. G3P3003 here today for eval of vulvar burning. The pt has been complaining of vulvar burning for several months. She was examined by her primary care provider and the in the ED (or urgent care) with no etiology for the sx. The family is concerned that the sx may be related to the pts dementia dx. The spouse reports that the pt will scream out in pain but, can be distracted.  No bleeding or abnormal discharge is noted.      The following portions of the patient's history were reviewed and updated as appropriate: allergies, current medications, past family history, past medical history, past social history, past surgical history and problem list.  Review of Systems:  Pertinent items are noted in HPI.    Objective:  Physical Exam Blood pressure 117/75, pulse 67, height 5\' 3"  (1.6 m), weight 173 lb 1.9 oz (78.5 kg).  CONSTITUTIONAL: Well-developed, well-nourished female in no acute distress.  HENT:  Normocephalic, atraumatic EYES: Conjunctivae and EOM are normal. No scleral icterus.  NECK: Normal range of motion SKIN: Skin is warm and dry. No rash noted. Not diaphoretic.No pallor. [place]: Alert and oriented to person, place, and time. Normal coordination.  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 The introitus is narrow. The vagina shows loss of ruggae but, no lesions are noted.   No masses palpated   Assessment & Plan:  Vulvar burning- both the atrophic vaginitis and the condyloma could contribute to the itching/buring. I have discussed with pts husband the option of resection of the condyloma. It would need to be removed under sedation as I am not sure if pt wold be able to understand the need to remain still during the procedure. I have also discussed treating the atrophic vaginitis first. If the sx resolve and the condyloma remains stable we can just observe. The  husband wants to proceed with the more conservative option.   Coconut oil to the vulvar area bid. F/u in 6 weeks in person to reassess sx.   Total face-to-face time with patient was 25 min.  Greater than 50% was spent in counseling and coordination of care with the patient.     Nylani Michetti L. Harraway-Smith, M.D., Cherlynn June

## 2019-01-13 ENCOUNTER — Telehealth: Payer: Self-pay

## 2019-01-13 NOTE — Telephone Encounter (Signed)
Pt's husband called the office wanting to know if pt can be prescribed replacement hormone therapy for menopausal symptoms. Pt made aware that I will discuss with provider and will contact him. Understanding was voiced.  Amandalynn Pitz l Duff Pozzi, CMA

## 2019-01-13 NOTE — Telephone Encounter (Signed)
-----   Message from Lavonia Drafts, MD sent at 01/13/2019 11:39 AM EDT ----- Regarding: RE: Menopause I would not recommend at present.   It she needs topical estrogen for the vaginal area we can use that but, would try to e coconut oil first.   Clh-S  ----- Message ----- From: Valentina Lucks, CMA Sent: 01/13/2019   9:25 AM EDT To: Lavonia Drafts, MD Subject: Menopause                                      Pt's husband called wanting to know if pt can start hormone therapy for menopausal symptoms

## 2019-01-13 NOTE — Telephone Encounter (Signed)
Pt's husband was made aware that hormone therapy is not recommended at this time. Pt advised to try coconut oil first and if needed topical estrogen cream will be prescribed. Understanding was voiced.  chiquita l wilson, CMA

## 2019-01-17 ENCOUNTER — Encounter: Payer: Self-pay | Admitting: Obstetrics & Gynecology

## 2019-02-23 ENCOUNTER — Ambulatory Visit (INDEPENDENT_AMBULATORY_CARE_PROVIDER_SITE_OTHER): Payer: Medicare Other | Admitting: Obstetrics & Gynecology

## 2019-02-23 ENCOUNTER — Encounter: Payer: Self-pay | Admitting: Obstetrics & Gynecology

## 2019-02-23 ENCOUNTER — Other Ambulatory Visit: Payer: Self-pay

## 2019-02-23 VITALS — BP 133/71 | HR 70 | Wt 174.0 lb

## 2019-02-23 DIAGNOSIS — N952 Postmenopausal atrophic vaginitis: Secondary | ICD-10-CM

## 2019-02-23 NOTE — Progress Notes (Signed)
History:  68 y.o. G3P3003 here today for f/u of complaints of vulvar pain. Pts husband reports that pts sx completely resolved after a few doses of the coconut oil. He believes that this may support the fact that the sx are more an issue with the dementia. The husbnad does not feel that the pt likes the coconut oil after her sx resolved. He reports that she now periodically complains of pain in the direct middle of her forehead. He cannot think of what this could be related to. The pain resolves with Tylenol or sometimes time.    The following portions of the patient's history were reviewed and updated as appropriate: allergies, current medications, past family history, past medical history, past social history, past surgical history and problem list.  Review of Systems:  Pertinent items are noted in HPI.    Objective:  Physical Exam Blood pressure 133/71, pulse 70, weight 174 lb (78.9 kg).  CONSTITUTIONAL: Well-developed, well-nourished female in no acute distress. Pt is responsive. Husband is her main caretaker and speak on her behalf due to advanced dementia.   HENT:  Normocephalic, atraumatic EYES: Conjunctivae and EOM are normal. No scleral icterus.  NECK: Normal range of motion SKIN: Skin is warm and dry. No rash noted. Not diaphoretic.No pallor. [place]: Alert and oriented to person  Pelvic: not indicated.   Assessment & Plan:  Atrophic vaginitis- sx improved  HA- recommend warm compress to head when HA occurs. If sx relieved may lessen the need for meds. Call primary crae provider if sx progress or worsen   Total face-to-face time with patient was 12 min.  Greater than 50% was spent in counseling and coordination of care with the patient.

## 2019-02-24 ENCOUNTER — Encounter: Payer: Self-pay | Admitting: Obstetrics & Gynecology

## 2019-07-01 ENCOUNTER — Encounter (HOSPITAL_BASED_OUTPATIENT_CLINIC_OR_DEPARTMENT_OTHER): Payer: Self-pay | Admitting: Emergency Medicine

## 2019-07-01 ENCOUNTER — Other Ambulatory Visit: Payer: Self-pay

## 2019-07-01 ENCOUNTER — Observation Stay (HOSPITAL_BASED_OUTPATIENT_CLINIC_OR_DEPARTMENT_OTHER)
Admission: EM | Admit: 2019-07-01 | Discharge: 2019-07-02 | Disposition: A | Discharge status: Home / Self Care | Payer: Medicare PPO | Attending: Internal Medicine | Admitting: Internal Medicine

## 2019-07-01 ENCOUNTER — Emergency Department (HOSPITAL_BASED_OUTPATIENT_CLINIC_OR_DEPARTMENT_OTHER): Payer: Medicare PPO

## 2019-07-01 DIAGNOSIS — D72829 Elevated white blood cell count, unspecified: Secondary | ICD-10-CM | POA: Insufficient documentation

## 2019-07-01 DIAGNOSIS — F329 Major depressive disorder, single episode, unspecified: Secondary | ICD-10-CM | POA: Diagnosis not present

## 2019-07-01 DIAGNOSIS — Z79899 Other long term (current) drug therapy: Secondary | ICD-10-CM | POA: Insufficient documentation

## 2019-07-01 DIAGNOSIS — R1013 Epigastric pain: Secondary | ICD-10-CM

## 2019-07-01 DIAGNOSIS — Z0189 Encounter for other specified special examinations: Secondary | ICD-10-CM

## 2019-07-01 DIAGNOSIS — F0281 Dementia in other diseases classified elsewhere with behavioral disturbance: Secondary | ICD-10-CM

## 2019-07-01 DIAGNOSIS — K56609 Unspecified intestinal obstruction, unspecified as to partial versus complete obstruction: Secondary | ICD-10-CM | POA: Diagnosis present

## 2019-07-01 DIAGNOSIS — I10 Essential (primary) hypertension: Secondary | ICD-10-CM | POA: Diagnosis not present

## 2019-07-01 DIAGNOSIS — E871 Hypo-osmolality and hyponatremia: Secondary | ICD-10-CM | POA: Insufficient documentation

## 2019-07-01 DIAGNOSIS — F02818 Dementia in other diseases classified elsewhere, unspecified severity, with other behavioral disturbance: Secondary | ICD-10-CM | POA: Diagnosis present

## 2019-07-01 DIAGNOSIS — E860 Dehydration: Secondary | ICD-10-CM | POA: Diagnosis present

## 2019-07-01 DIAGNOSIS — Z20822 Contact with and (suspected) exposure to covid-19: Secondary | ICD-10-CM | POA: Insufficient documentation

## 2019-07-01 DIAGNOSIS — R112 Nausea with vomiting, unspecified: Secondary | ICD-10-CM | POA: Diagnosis not present

## 2019-07-01 DIAGNOSIS — F0390 Unspecified dementia without behavioral disturbance: Secondary | ICD-10-CM | POA: Diagnosis not present

## 2019-07-01 DIAGNOSIS — R7303 Prediabetes: Secondary | ICD-10-CM | POA: Insufficient documentation

## 2019-07-01 DIAGNOSIS — G300 Alzheimer's disease with early onset: Secondary | ICD-10-CM

## 2019-07-01 DIAGNOSIS — R109 Unspecified abdominal pain: Secondary | ICD-10-CM

## 2019-07-01 DIAGNOSIS — R197 Diarrhea, unspecified: Secondary | ICD-10-CM

## 2019-07-01 DIAGNOSIS — I700 Atherosclerosis of aorta: Secondary | ICD-10-CM | POA: Diagnosis not present

## 2019-07-01 DIAGNOSIS — E119 Type 2 diabetes mellitus without complications: Secondary | ICD-10-CM

## 2019-07-01 DIAGNOSIS — K56600 Partial intestinal obstruction, unspecified as to cause: Secondary | ICD-10-CM | POA: Diagnosis present

## 2019-07-01 HISTORY — DX: Allergic rhinitis, unspecified: J30.9

## 2019-07-01 LAB — HEMOGLOBIN A1C
Hgb A1c MFr Bld: 5.8 % — ABNORMAL HIGH (ref 4.8–5.6)
Mean Plasma Glucose: 119.76 mg/dL

## 2019-07-01 LAB — GLUCOSE, CAPILLARY: Glucose-Capillary: 104 mg/dL — ABNORMAL HIGH (ref 70–99)

## 2019-07-01 LAB — COMPREHENSIVE METABOLIC PANEL
ALT: 15 U/L (ref 0–44)
AST: 24 U/L (ref 15–41)
Albumin: 4.7 g/dL (ref 3.5–5.0)
Alkaline Phosphatase: 68 U/L (ref 38–126)
Anion gap: 12 (ref 5–15)
BUN: 20 mg/dL (ref 8–23)
CO2: 24 mmol/L (ref 22–32)
Calcium: 10.6 mg/dL — ABNORMAL HIGH (ref 8.9–10.3)
Chloride: 98 mmol/L (ref 98–111)
Creatinine, Ser: 1.05 mg/dL — ABNORMAL HIGH (ref 0.44–1.00)
GFR calc Af Amer: >60 mL/min (ref >60)
GFR calc non Af Amer: 54 mL/min — ABNORMAL LOW (ref >60)
Glucose, Bld: 123 mg/dL — ABNORMAL HIGH (ref 70–99)
Potassium: 3.7 mmol/L (ref 3.5–5.1)
Sodium: 134 mmol/L — ABNORMAL LOW (ref 135–145)
Total Bilirubin: 1.2 mg/dL (ref 0.3–1.2)
Total Protein: 8.6 g/dL — ABNORMAL HIGH (ref 6.5–8.1)

## 2019-07-01 LAB — CBC WITH DIFFERENTIAL/PLATELET
Abs Immature Granulocytes: 0.05 10*3/uL (ref 0.00–0.07)
Basophils Absolute: 0 10*3/uL (ref 0.0–0.1)
Basophils Relative: 0 %
Eosinophils Absolute: 0.1 10*3/uL (ref 0.0–0.5)
Eosinophils Relative: 0 %
HCT: 43.5 % (ref 36.0–46.0)
Hemoglobin: 14.3 g/dL (ref 12.0–15.0)
Immature Granulocytes: 0 %
Lymphocytes Relative: 21 %
Lymphs Abs: 2.9 10*3/uL (ref 0.7–4.0)
MCH: 28.4 pg (ref 26.0–34.0)
MCHC: 32.9 g/dL (ref 30.0–36.0)
MCV: 86.5 fL (ref 80.0–100.0)
Monocytes Absolute: 0.8 10*3/uL (ref 0.1–1.0)
Monocytes Relative: 6 %
Neutro Abs: 10 10*3/uL — ABNORMAL HIGH (ref 1.7–7.7)
Neutrophils Relative %: 73 %
Platelets: 233 10*3/uL (ref 150–400)
RBC: 5.03 MIL/uL (ref 3.87–5.11)
RDW: 14.6 % (ref 11.5–15.5)
WBC: 13.8 10*3/uL — ABNORMAL HIGH (ref 4.0–10.5)
nRBC: 0 % (ref 0.0–0.2)

## 2019-07-01 LAB — URINALYSIS, ROUTINE W REFLEX MICROSCOPIC
Bilirubin Urine: NEGATIVE
Glucose, UA: NEGATIVE mg/dL
Hgb urine dipstick: NEGATIVE
Ketones, ur: NEGATIVE mg/dL
Leukocytes,Ua: NEGATIVE
Nitrite: NEGATIVE
Protein, ur: NEGATIVE mg/dL
Specific Gravity, Urine: <1.005 — ABNORMAL LOW (ref 1.005–1.030)
pH: 6.5 (ref 5.0–8.0)

## 2019-07-01 LAB — RESPIRATORY PANEL BY RT PCR (FLU A&B, COVID)
Influenza A by PCR: NEGATIVE
Influenza B by PCR: NEGATIVE
SARS Coronavirus 2 by RT PCR: NEGATIVE

## 2019-07-01 LAB — HIV ANTIBODY (ROUTINE TESTING W REFLEX): HIV Screen 4th Generation wRfx: NONREACTIVE

## 2019-07-01 LAB — LIPASE, BLOOD: Lipase: 20 U/L (ref 11–51)

## 2019-07-01 LAB — GROUP A STREP BY PCR: Group A Strep by PCR: NOT DETECTED

## 2019-07-01 MED ORDER — LORAZEPAM 2 MG/ML IJ SOLN
1.0000 mg | Freq: Once | INTRAMUSCULAR | Status: AC
  Administered 2019-07-01: 1 mg via INTRAVENOUS
  Filled 2019-07-01: qty 1

## 2019-07-01 MED ORDER — SODIUM CHLORIDE 0.9 % IV SOLN
8.0000 mg | Freq: Four times a day (QID) | INTRAVENOUS | Status: DC | PRN
  Filled 2019-07-01: qty 4

## 2019-07-01 MED ORDER — HYDRALAZINE HCL 20 MG/ML IJ SOLN: 10.0000 mg | INTRAMUSCULAR | Status: DC | PRN

## 2019-07-01 MED ORDER — LACTATED RINGERS IV BOLUS
1000.0000 mL | Freq: Three times a day (TID) | INTRAVENOUS | Status: DC | PRN
  Administered 2019-07-01: 1000 mL via INTRAVENOUS

## 2019-07-01 MED ORDER — ENOXAPARIN SODIUM 40 MG/0.4ML ~~LOC~~ SOLN
40.0000 mg | SUBCUTANEOUS | Status: DC
  Administered 2019-07-01: 40 mg via SUBCUTANEOUS
  Filled 2019-07-01: qty 0.4

## 2019-07-01 MED ORDER — LACTATED RINGERS IV SOLN: INTRAVENOUS | Status: DC

## 2019-07-01 MED ORDER — HALOPERIDOL LACTATE 5 MG/ML IJ SOLN: 2.0000 mg | Freq: Four times a day (QID) | INTRAMUSCULAR | Status: DC | PRN

## 2019-07-01 MED ORDER — QUETIAPINE FUMARATE 25 MG PO TABS
50.0000 mg | ORAL_TABLET | Freq: Every day | ORAL | Status: DC
  Administered 2019-07-01: 50 mg via ORAL
  Filled 2019-07-01: qty 2

## 2019-07-01 MED ORDER — MAGIC MOUTHWASH
15.0000 mL | Freq: Four times a day (QID) | ORAL | Status: DC | PRN
  Filled 2019-07-01: qty 15

## 2019-07-01 MED ORDER — IOHEXOL 300 MG/ML  SOLN
100.0000 mL | Freq: Once | INTRAMUSCULAR | Status: AC | PRN
  Administered 2019-07-01: 100 mL via INTRAVENOUS

## 2019-07-01 MED ORDER — QUETIAPINE FUMARATE 25 MG PO TABS
25.0000 mg | ORAL_TABLET | Freq: Every day | ORAL | Status: DC
  Administered 2019-07-02: 25 mg via ORAL
  Filled 2019-07-01: qty 1

## 2019-07-01 MED ORDER — INSULIN ASPART 100 UNIT/ML ~~LOC~~ SOLN: 0.0000 [IU] | Freq: Three times a day (TID) | SUBCUTANEOUS | Status: DC

## 2019-07-01 MED ORDER — METOPROLOL SUCCINATE ER 25 MG PO TB24
25.0000 mg | ORAL_TABLET | Freq: Every day | ORAL | Status: DC
  Administered 2019-07-02: 25 mg via ORAL
  Filled 2019-07-01: qty 1

## 2019-07-01 MED ORDER — SODIUM CHLORIDE 0.9 % IV BOLUS
1000.0000 mL | Freq: Once | INTRAVENOUS | Status: AC
  Administered 2019-07-01: 1000 mL via INTRAVENOUS

## 2019-07-01 MED ORDER — ONDANSETRON HCL 4 MG/2ML IJ SOLN
4.0000 mg | Freq: Once | INTRAMUSCULAR | Status: AC
  Administered 2019-07-01: 4 mg via INTRAVENOUS
  Filled 2019-07-01: qty 2

## 2019-07-01 MED ORDER — ONDANSETRON HCL 4 MG PO TABS: 4.0000 mg | ORAL_TABLET | Freq: Four times a day (QID) | ORAL | Status: DC | PRN

## 2019-07-01 MED ORDER — DIATRIZOATE MEGLUMINE & SODIUM 66-10 % PO SOLN
90.0000 mL | Freq: Once | ORAL | Status: AC
  Administered 2019-07-01: 90 mL via ORAL
  Filled 2019-07-01: qty 90

## 2019-07-01 MED ORDER — BISACODYL 10 MG RE SUPP: 10.0000 mg | Freq: Two times a day (BID) | RECTAL | Status: DC | PRN

## 2019-07-01 MED ORDER — ONDANSETRON HCL 4 MG/2ML IJ SOLN: 4.0000 mg | Freq: Four times a day (QID) | INTRAMUSCULAR | Status: DC | PRN

## 2019-07-01 MED ORDER — ACETAMINOPHEN 650 MG RE SUPP: 650.0000 mg | Freq: Four times a day (QID) | RECTAL | Status: DC | PRN

## 2019-07-01 MED ORDER — LIP MEDEX EX OINT
TOPICAL_OINTMENT | CUTANEOUS | Status: AC
  Administered 2019-07-01: 1 via TOPICAL
  Filled 2019-07-01: qty 7

## 2019-07-01 MED ORDER — QUETIAPINE FUMARATE 25 MG PO TABS: 25.0000 mg | ORAL_TABLET | Freq: Two times a day (BID) | ORAL | Status: DC

## 2019-07-01 MED ORDER — LIP MEDEX EX OINT
1.0000 "application " | TOPICAL_OINTMENT | Freq: Two times a day (BID) | CUTANEOUS | Status: DC
  Administered 2019-07-02: 1 via TOPICAL

## 2019-07-01 NOTE — Progress Notes (Signed)
Complains of significant abdominal pain for last 3 days, some nausea and vomiting, poor appetite, as well she had fever yesterday, CT abdomen pelvis significant for partial small bowel obstruction, ED discussed with Dr. Ricky Stabs, he will consult when patient gets stool with the long, patient will be accepted as PUI given she had fever yesterday, COVID-19 test is pending. Huey Bienenstock MD

## 2019-07-01 NOTE — H&P (Signed)
History and Physical    Melanie Kaiser DXA:[id number] DOB: 1951/03/31 DOA: 07/01/2019  I have briefly reviewed the patient's prior medical records in [facility]  PCP: Gweneth Dimitri, MD  Patient coming from: home, [facility]  Chief Complaint: abdominal pain  HPI: Melanie Kaiser is a 69 y.o. female with medical history significant of significant dementia, hypertension, prediabetes, who comes to the hospital brought by the family due to complaints of abdominal pain.  Patient has dementia and she is unable to give a coherent story so HPI is per husband who is at bedside.  He tells me that she has been having low degree of chronic abdominal pain for a number of years, however in the last 4 days she has been having severe abdominal pain, continuous, and crying at times.  He denies patient having any episodes of vomiting, but he recalls seeing her gagging/nauseous, and over the last 3 days she has been refusing any food.  There are no other reported complaints, no chest pain, shortness of breath.  There is a reported fever yesterday.  Also hospital reports some loose stools but not unusual for her.  ED Course: In the emergency room patient is afebrile 98.4, heart rate is around 100, she is normotensive and satting well on room air.  Her blood work reveals a sodium of 134, glucose 123, creatinine 1.0, WBC 15.8.  SARS-CoV-2 is negative.  Urinalysis is unremarkable.  CT scan of the abdomen and pelvis showed low-grade partial obstruction of the small bowel.  General surgery was consulted and patient was admitted to the hospital.   Review of Systems: unable to obtain full review of systems due to dementia  Past Medical History:  Diagnosis Date  . Allergy allergic   allergic rhinitis  . 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 Depression   . Diabetes mellitus   . Hypertension     Past Surgical History:  Procedure Laterality Date  . ABDOMINAL HYSTERECTOMY     vaginal  .  BRAIN AVM REPAIR       reports that she has never smoked. She has never used smokeless tobacco. She reports that she does not drink alcohol or use drugs.  No Known Allergies  Family History  Problem Relation Age of Onset  . Mental illness Father   . Stroke Father     Prior to Admission medications   Medication Sig Start Date End Date Taking? Authorizing Provider  allopurinol (ZYLOPRIM) 100 MG tablet Take 100 mg by mouth daily. 09/17/17   [provider]  atenolol (TENORMIN) 100 MG tablet TAKE ONE TABLET BY MOUTH EVERY DAY 11/06/10   Margaree Mackintosh, MD  B Complex Vitamins (VITAMIN B COMPLEX) TABS Take 1 tablet by mouth daily.    [provider]  CINNAMON PO Take by mouth.    [provider]  ELDERBERRY PO Take by mouth.    [provider]  fluticasone (CUTIVATE) 0.05 % cream Apply topically 2 (two) times daily. 12/31/12   Linna Hoff, MD  Melatonin 5 MG TABS Take 1 tablet by mouth at bedtime.    [provider]  metoprolol succinate (TOPROL-XL) 25 MG 24 hr tablet Take 25 mg by mouth daily. 09/17/17   [provider]  mirtazapine (REMERON) 7.5 MG tablet Take 7.5 mg by mouth at bedtime.  11/27/17   [provider]  Multiple Vitamins-Minerals (MULTIVITAMIN ADULT) TABS Take 1 tablet by mouth daily.    [provider]  NAMZARIC 28-10 MG CP24 Take 1 capsule by mouth daily. 09/17/17   [provider]  OVER THE COUNTER MEDICATION     [provider]  QUEtiapine (SEROQUEL) 25 MG tablet Take 25 mg by mouth 2 (two) times daily. 25 mg in the morning and 50 mg at night    [provider]  sertraline (ZOLOFT) 100 MG tablet Take 150 mg by mouth daily.  09/17/17   [provider]  sertraline (ZOLOFT) 50 MG tablet TAKE ONE TABLET BY MOUTH EVERY DAY *NEED OFFICE VISIT* Patient not taking: Reported on 12/04/2017 04/23/11   Elby Showers, MD  triamterene-hydrochlorothiazide (MAXZIDE-25) 37.5-25 MG per  tablet TAKE ONE TABLET BY MOUTH EVERY DAY 01/02/11   Elby Showers, MD  vitamin C (ASCORBIC ACID) 500 MG tablet Take 500 mg by mouth daily.    [provider]    Physical Exam: Vitals:   07/01/19 0932 07/01/19 1116 07/01/19 1305 07/01/19 1610  BP:  140/76 112/71 126/76  Pulse:  100 74 (!) 101  Resp:  16 18 18   Temp:   98.4 F (36.9 C) 99 F (37.2 C)  TempSrc:   Oral Oral  SpO2:  99% 98% 98%  Weight: 77.1 kg     Height: 5\' 4"  (1.626 m)       Constitutional: NAD, calm, confused and mumbling Eyes: PERRL, lids and conjunctivae normal ENMT: Mucous membranes are moist. Posterior pharynx clear of any exudate or lesions.Normal dentition.  Neck: normal, supple Respiratory: clear to auscultation bilaterally, no wheezing, no crackles. Normal respiratory effort. No accessory muscle use.  Cardiovascular: Regular rate and rhythm, no murmurs / rubs / gallops. No extremity edema. 2+ pedal pulses.  Abdomen: Soft, no masses, very mild tenderness to palpation throughout but no guarding or rebound. Musculoskeletal: no clubbing / cyanosis. Normal muscle tone.  Skin: no rashes appreciated Neurologic: CN 2-12 grossly intact. Strength 5/5 in all 4.  Psychiatric: Alert to self only  Labs on Admission: I have personally reviewed following labs and imaging studies  CBC: Recent Labs  Lab 07/01/19 1001  WBC 13.8*  NEUTROABS 10.0*  HGB 14.3  HCT 43.5  MCV 86.5  PLT 875   Basic Metabolic Panel: Recent Labs  Lab 07/01/19 1001  NA 134*  K 3.7  CL 98  CO2 24  GLUCOSE 123*  BUN 20  CREATININE 1.05*  CALCIUM 10.6*   Liver Function Tests: Recent Labs  Lab 07/01/19 1001  AST 24  ALT 15  ALKPHOS 68  BILITOT 1.2  PROT 8.6*  ALBUMIN 4.7   Coagulation Profile: No results for input(s): INR, PROTIME in the last 168 hours. BNP (last 3 results) No results for input(s): PROBNP in the last 8760 hours. CBG: No results for input(s): GLUCAP in the last 168 hours. Thyroid Function  Tests: No results for input(s): TSH, T4TOTAL, FREET4, T3FREE, THYROIDAB in the last 72 hours. Urine analysis:    Component Value Date/Time   COLORURINE STRAW (A) 07/01/2019 1154   APPEARANCEUR CLEAR 07/01/2019 1154   LABSPEC <1.005 (L) 07/01/2019 1154   PHURINE 6.5 07/01/2019 1154   GLUCOSEU NEGATIVE 07/01/2019 1154   HGBUR NEGATIVE 07/01/2019 1154   BILIRUBINUR NEGATIVE 07/01/2019 1154   KETONESUR NEGATIVE 07/01/2019 1154   PROTEINUR NEGATIVE 07/01/2019 1154   NITRITE NEGATIVE 07/01/2019 1154   LEUKOCYTESUR NEGATIVE 07/01/2019 1154     Radiological Exams on Admission: CT Abdomen Pelvis W Contrast  Result Date: 07/01/2019 CLINICAL DATA:  Abdominal pain. EXAM: CT ABDOMEN  AND PELVIS WITH CONTRAST TECHNIQUE: Multidetector CT imaging of the abdomen and pelvis was performed using the standard protocol following bolus administration of intravenous contrast. CONTRAST:  OMNIPAQUE IOHEXOL 300 MG/ML  SOLN COMPARISON:  None FINDINGS: Lower chest: Scarring identified within both lung bases. Hepatobiliary: No suspicious liver abnormality. Gallbladder is unremarkable. No gallbladder wall inflammation. No bile duct dilatation. Pancreas: Unremarkable. No pancreatic ductal dilatation or surrounding inflammatory changes. Spleen: Normal in size without focal abnormality. Adrenals/Urinary Tract: Normal appearance of the adrenal glands. Right upper pole kidney cyst measures 1.4 cm. Additional, less than 1 cm low-density foci are identified, too small to characterize. No suspicious enhancing mass or hydronephrosis identified bilaterally. The urinary bladder is normal. Stomach/Bowel: Stomach is nondistended. Proximal small bowel loops are unremarkable. There is mild increase caliber of the mid small bowel loops which measure up to 2.8 cm. There is a transition point to decreased caliber distal small bowel within the right lower quadrant of the abdomen, image 46/5 and image 62/2. Unremarkable appearance of the  colon. Vascular/Lymphatic: Aortic atherosclerosis. No aneurysm. No abdominopelvic adenopathy. Reproductive: Status post hysterectomy. No adnexal masses. Other: There is a small volume of free fluid identified within the left lower quadrant of the abdomen and in the dependent portion of the pelvis. No focal fluid collections identified. Musculoskeletal: No acute or significant osseous findings. IMPRESSION: 1. Increase caliber of the mid small bowel loops with transition to decreased caliber distal small bowel. Findings are concerning for low-grade partial obstruction of the small bowel. 2. Small volume of free fluid noted within the left lower quadrant of the abdomen and dependent portion of the pelvis. Aortic Atherosclerosis (ICD10-I70.0). Electronically Signed   By: Signa Kell M.D.   On: 07/01/2019 11:51     Assessment/Plan  Principal Problem Small bowel obstruction -No episodes of vomiting, hold off on placing NG tube.  Start IV fluids, general surgery consulted, appreciate input. -N.p.o. except meds  Active Problems Underlying dementia -Supportive treatment, she is likely to sundown, resume home Seroquel, as needed Haldol  Mild hyponatremia -Likely due to dehydration, IV fluids overnight, repeat sodium levels in the morning  Prediabetes -Glucose on admission 123, placed on sliding scale and monitor.  Leukocytosis -Likely in the setting of #1, evidence of a bacterial infection  Essential hypertension -Resume home metoprolol, hydralazine as needed  Depression -Hold Zoloft is trying to minimize oral meds as much as possible only to essentials   DVT prophylaxis: Lovenox Code Status: Full code Family Communication: Discussed with husband Alinda Money at bedside Disposition Plan: Home when ready Bed Type: MedSurg Consults called: General surgery Obs/Inp: inpatient   At the time of admission, it appears that the appropriate admission status for this patient is INPATIENT as it is  expected that patient will require hospital care > 2 midnights. This is judged to be reasonable and necessary in order to provide the required intensity of service to ensure the patient's safety given: presenting symptoms of SBO, initial radiographic and laboratory data and in the context of their chronic comorbidities.

## 2019-07-01 NOTE — Consult Note (Signed)
Melanie Kaiser  17-Jan-1951 [id number]  CARE TEAM:  PCP: Gweneth DimitriMcNeill, Wendy, MD  Outpatient Care Team: Patient Care Team: Gweneth DimitriMcNeill, Wendy, MD as PCP - General (Family Medicine)  Inpatient Treatment Team: Treatment Team: Attending Provider: Starleen ArmsElgergawy, Dawood S, MD; Rounding Team: Arlean Hoppingriadhosp, Wladmits, MD; Consulting Physician: Montez Moritacs, Md, MD   This patient is a 69 y.o.female who presents today for surgical evaluation at the request of Dr Particia NearingHaviland.   Chief complaint / Reason for evaluation: Decreased appetite with conference of ileus versus bowel obstruction  Woman with many health issues.  History of significant dementia.  Husband's caretaker is at bedside and access historian given her moderate dementia.  Patient has had intermittent complaints of crampy abdominal pain and loose bowel movements for many years.  Had been placed on appetite stimulant which seem to help.  However over the past few days her appetite has been decreased.  Not wanting to eat much.  Complaining of abdominal discomfort and crampiness.  No vomiting.  Still with loose bowel movements.  No dysphagia to solids or liquids.  He was concerned that she had a fever.  Brought her in emergency room.  The fever apparently went away on its own without medicine.  Work-up negative for any obvious infectious etiology.  Given vague abdominal complaints CAT scan performed which showed some dilated small bowel loops.  Question of bowel obstruction raise.  Emergency department did not feel it was safe for her to go home.  Requested hospitalization.  Medical admission and surgical consultation.  Patient is on medication for dementia.  No major change in behavior.  No dyspnea on exertion nor obvious chest pain.  No hematochezia nor melena.     Assessment  Melanie Kaiser  69 y.o. 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  Plan:  I am skeptical that she truly has a bowel obstruction by CAT scan.  She could have an ileus.  Proximal small bowel is only mildly dilated.  There is no clear transition point.  She is only had a hysterectomy.  She is not in pain or toxic or sickly right now.  There is no mass or tumor.  There is no perforation or free air.  There is no evidence of ileitis or diverticulitis or appendicitis.  No urinary tract infection.  Reasonable to do the small bowel protocol.  Because of her dementia and poor tolerance to a nasogastric tube, I would just have her drink the oral Gastrografin study and do 8 and 24 films.  If contrast gets into the colon, that is reassuring that she truly does not have an obstruction this is more of an ileus of other etiology.  Should be she become more distended or have findings more suspicious for bowel obstruction, then can reconsider nasogastric tube decompression.  I worry with her dementia she may just pulled the tube out but we could see.  Certainly there is no need for abdominal surgical intervention at this time  Continue work-up to rule out other etiologies of abdominal complaints.  She is SARS-Covid 2 negative.  No obvious major electrolyte abnormalities.  She does not have any peritonitis.  No real abdominal pain  to my examination.      VTE prophylaxis- SCDs, etc  Mobilize as tolerated to help recovery    40 minutes spent in review, evaluation, examination, counseling, and coordination of care.  More than 50% of that time was spent in counseling.  I updated the patient's status to the The patient especially her husband.  Again discussed with the patient's daughter on the phone per the patient's husband's request.  Discussed with the patient's nurse.Marland Kitchen   Recommendations were made.  Questions were answered. Discussed with Dr. Elvera Lennox, admitting physician  They expressed understanding & appreciation.   Ardeth Sportsman, MD, FACS, MASCRS Gastrointestinal and Minimally Invasive Surgery  [facility] [address], [address] [place], [place] [address] [phone] Main / Paging [phone] Fax     07/01/2019      Past Medical History:  Diagnosis Date  . Allergic rhinitis allergic   allergic rhinitis  . 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 Depression   . Diabetes mellitus   . Hypertension     Past Surgical History:  Procedure Laterality Date  . ABDOMINAL HYSTERECTOMY     vaginal  . BRAIN AVM REPAIR      Social History   Socioeconomic History  . Marital status: Married    Spouse name: Not on file  . Number of children: Not on file  . Years of education: Not on file  . Highest education level: Not on file  Occupational History  . Not on file  Tobacco Use  . Smoking status: Never Smoker  . Smokeless tobacco: Never Used  Substance and Sexual Activity  . Alcohol use: No  . Drug use: Never  . Sexual activity: Yes    Birth control/protection: None  Other Topics Concern  . Not on file  Social History Narrative  . Not on file   Social Determinants of Health   Financial Resource Strain:   . Difficulty of Paying Living Expenses: Not on file  Food Insecurity:   . Worried About Programme researcher, broadcasting/film/video in the Last Year: Not on file  . Ran Out of Food in the Last Year: Not on file  Transportation Needs:   . Lack of Transportation (Medical): Not on file  . Lack of Transportation (Non-Medical): Not on file  Physical Activity:   . Days of Exercise per Week: Not on file  . Minutes of Exercise per Session: Not on file  Stress:   . Feeling of Stress : Not on file  Social Connections:   . Frequency of Communication with Friends and Family: Not on file  . Frequency of Social  Gatherings with Friends and Family: Not on file  . Attends Religious Services: Not on file  . Active Member of Clubs or Organizations: Not on file  . Attends Banker Meetings: Not on file  . Marital Status: Not on file  Intimate Partner Violence:   . Fear of Current or Ex-Partner: Not on file  . Emotionally Abused: Not on file  . Physically Abused: Not on file  . Sexually Abused: Not on file    Family History  Problem Relation Age of Onset  . Mental illness Father   . Stroke Father     Current Facility-Administered Medications  Medication Dose Route Frequency Provider Last Rate Last Admin  . acetaminophen (TYLENOL) suppository 650 mg  650 mg Rectal Q6H PRN Karie Soda, MD      . bisacodyl (DULCOLAX) suppository 10  mg  10 mg Rectal Q12H PRN Michael Boston, MD      . diatrizoate meglumine-sodium (GASTROGRAFIN) 66-10 % solution 90 mL  90 mL Oral Once Michael Boston, MD      . enoxaparin (LOVENOX) injection 40 mg  40 mg Subcutaneous Q24H Gherghe, Costin M, MD      . haloperidol lactate (HALDOL) injection 2 mg  2 mg Intravenous Q6H PRN Caren Griffins, MD      . hydrALAZINE (APRESOLINE) injection 10 mg  10 mg Intravenous Q4H PRN Gherghe, Costin M, MD      . insulin aspart (novoLOG) injection 0-9 Units  0-9 Units Subcutaneous TID WC Gherghe, Costin M, MD      . lactated ringers bolus 1,000 mL  1,000 mL Intravenous Q8H PRN Michael Boston, MD      . lactated ringers infusion   Intravenous Continuous Gherghe, Vella Redhead, MD      . lip balm (CARMEX) ointment 1 application  1 application Topical BID Michael Boston, MD      . magic mouthwash  15 mL Oral QID PRN Michael Boston, MD      . Derrill Memo ON 07/02/2019] metoprolol succinate (TOPROL-XL) 24 hr tablet 25 mg  25 mg Oral Daily Gherghe, Costin M, MD      . ondansetron (ZOFRAN) injection 4 mg  4 mg Intravenous Q6H PRN Michael Boston, MD       Or  . ondansetron (ZOFRAN) 8 mg in sodium chloride 0.9 % 50 mL IVPB  8 mg Intravenous Q6H PRN  Michael Boston, MD      . ondansetron (ZOFRAN) tablet 4 mg  4 mg Oral Q6H PRN Caren Griffins, MD       Or  . ondansetron (ZOFRAN) injection 4 mg  4 mg Intravenous Q6H PRN Caren Griffins, MD      . QUEtiapine (SEROQUEL) tablet 25 mg  25 mg Oral BID Caren Griffins, MD         No Known Allergies  ROS:   Patient unable to give history.  Therefore per husband.  All other systems reviewed & are negative except per HPI or as noted below: Constitutional:  + fevers, No chills, sweats.  Weight stable.  Appetite decreased last 3 days Eyes:  No vision changes, No discharge HENT:  No sore throats, nasal drainage Lymph: No neck swelling, No bruising easily Pulmonary:  No cough, productive sputum CV: No orthopnea, PND.    No exertional chest/neck/shoulder/arm pain. GI:  No personal nor family history of GI/colon cancer, inflammatory bowel disease, allergy such as Celiac Sprue, dietary/dairy problems, colitis, ulcers nor gastritis.  No travel outside the country.  Renal: No UTIs, No hematuria Genital:  No drainage, bleeding, masses Musculoskeletal: No severe joint pain.  Good ROM major joints Skin:  No sores or lesions.  No rashes Heme/Lymph:  No easy bleeding.  No swollen lymph nodes Neuro: No focal weakness/numbness.  No seizures Psych: Has baseline dementia but not particularly agitated nor more confused than baseline.  She was more emotional and crying that made the husband suspect she may have abdominal pain.  However she is calm now.  No suicidal ideation.    BP 126/76 (BP Location: Right Arm)   Pulse (!) 101   Temp 99 F (37.2 C) (Oral)   Resp 18   Ht 5\' 4"  (1.626 m)   Wt 77.1 kg   SpO2 98%   BMI 29.18 kg/m   Physical Exam: General: Pt awake/alert in no  major acute distress.  Talking comfortably with husband. Eyes: PERRL, normal EOM. Sclera nonicteric Neuro: CN II-XII intact w/o focal sensory/motor deficits. Lymph: No head/neck/groin lymphadenopathy Psych:  No  delerium/psychosis/paranoia.  Patient seems somewhat surprised by husband's history as he explains what has been going on, consistent with her dementia and fair memory recall.  However she is calm and not agitated.  Oriented x1 HENT: Normocephalic, Mucus membranes moist.  No thrush.   Neck: Supple, No tracheal deviation.  No obvious thyromegaly Chest: No pain to chest wall compression.  Good respiratory excursion.  No audible wheezing CV:  Pulses intact.  Regular rhythm.  No major extremity edema  Abdomen: Overweight but soft, Nondistended.  Nontender.  No guarding.  No discomfort to bed shake or percussion.  No evidence of peritonitis.  No incarcerated hernias.  Gen:  No inguinal hernias.  No inguinal lymphadenopathy.   Ext: No obvious deformity or contracture no significant edema.  No cyanosis Skin: No petechiae / purpurea.  No major sores.  Warm and dry Musculoskeletal: No severe joint pain.  Good ROM major joints   Results:   Labs: Results for orders placed or performed during the hospital encounter of 07/01/19 (from the past 48 hour(s))  CBC with Differential     Status: Abnormal   Collection Time: 07/01/19 10:01 AM  Result Value Ref Range   WBC 13.8 (H) 4.0 - 10.5 K/uL   RBC 5.03 3.87 - 5.11 MIL/uL   Hemoglobin 14.3 12.0 - 15.0 g/dL   HCT 19.7 58.8 - 32.5 %   MCV 86.5 80.0 - 100.0 fL   MCH 28.4 26.0 - 34.0 pg   MCHC 32.9 30.0 - 36.0 g/dL   RDW 49.8 26.4 - 15.8 %   Platelets 233 150 - 400 K/uL   nRBC 0.0 0.0 - 0.2 %   Neutrophils Relative % 73 %   Neutro Abs 10.0 (H) 1.7 - 7.7 K/uL   Lymphocytes Relative 21 %   Lymphs Abs 2.9 0.7 - 4.0 K/uL   Monocytes Relative 6 %   Monocytes Absolute 0.8 0.1 - 1.0 K/uL   Eosinophils Relative 0 %   Eosinophils Absolute 0.1 0.0 - 0.5 K/uL   Basophils Relative 0 %   Basophils Absolute 0.0 0.0 - 0.1 K/uL   Immature Granulocytes 0 %   Abs Immature Granulocytes 0.05 0.00 - 0.07 K/uL    Comment: Performed at [facility], [address] [place], [place] [address]  Comprehensive metabolic panel     Status: Abnormal   Collection Time: 07/01/19 10:01 AM  Result Value Ref Range   Sodium 134 (L) 135 - 145 mmol/L   Potassium 3.7 3.5 - 5.1 mmol/L   Chloride 98 98 - 111 mmol/L   CO2 24 22 - 32 mmol/L   Glucose, Bld 123 (H) 70 - 99 mg/dL    Comment: Glucose reference range applies only to samples taken after fasting for at least 8 hours.   BUN 20 8 - 23 mg/dL   Creatinine, Ser 7.68 (H) 0.44 - 1.00 mg/dL   Calcium 08.8 (H) 8.9 - 10.3 mg/dL   Total Protein 8.6 (H) 6.5 - 8.1 g/dL   Albumin 4.7 3.5 - 5.0 g/dL   AST 24 15 - 41 U/L   ALT 15 0 - 44 U/L   Alkaline Phosphatase 68 38 - 126 U/L   Total Bilirubin 1.2 0.3 - 1.2 mg/dL   GFR calc non Af Amer 54 (L) >60 mL/min  GFR calc Af Amer >60 >60 mL/min   Anion gap 12 5 - 15    Comment: Performed at [facility], [address] [place], [place] [address]  Lipase, blood     Status: None   Collection Time: 07/01/19 10:01 AM  Result Value Ref Range   Lipase 20 11 - 51 U/L    Comment: Performed at [facility], [address] [place], [place] [address]  Urinalysis, Routine w reflex microscopic     Status: Abnormal   Collection Time: 07/01/19 11:54 AM  Result Value Ref Range   Color, Urine STRAW (A) YELLOW   APPearance CLEAR CLEAR   Specific Gravity, Urine <1.005 (L) 1.005 - 1.030   pH 6.5 5.0 - 8.0   Glucose, UA NEGATIVE NEGATIVE mg/dL   Hgb urine dipstick NEGATIVE NEGATIVE   Bilirubin Urine NEGATIVE NEGATIVE   Ketones, ur NEGATIVE NEGATIVE mg/dL   Protein, ur NEGATIVE NEGATIVE mg/dL   Nitrite NEGATIVE NEGATIVE   Leukocytes,Ua NEGATIVE NEGATIVE    Comment: Microscopic not done on urines with negative protein, blood, leukocytes, nitrite, or glucose < 500 mg/dL. Performed at [facility], [address] [place], [place] [address]   Respiratory Panel by RT PCR (Flu A&B, Covid) - Nasopharyngeal Swab     Status: None   Collection  Time: 07/01/19 12:55 PM   Specimen: Nasopharyngeal Swab  Result Value Ref Range   SARS Coronavirus 2 by RT PCR NEGATIVE NEGATIVE    Comment: (NOTE) SARS-CoV-2 target nucleic acids are NOT DETECTED. The SARS-CoV-2 RNA is generally detectable in upper respiratoy specimens during the acute phase of infection. The lowest concentration of SARS-CoV-2 viral copies this assay can detect is 131 copies/mL. A negative result does not preclude SARS-Cov-2 infection and should not be used as the sole basis for treatment or other patient management decisions. A negative result may occur with  improper specimen collection/handling, submission of specimen other than nasopharyngeal swab, presence of viral mutation(s) within the areas targeted by this assay, and inadequate number of viral copies (<131 copies/mL). A negative result must be combined with clinical observations, patient history, and epidemiological information. The expected result is Negative. Fact Sheet for Patients:  https://www.moore.com/ Fact Sheet for Healthcare Providers:  https://www.young.biz/ This test is not yet ap proved or cleared by the [place] FDA and  has been authorized for detection and/or diagnosis of SARS-CoV-2 by FDA under an Emergency Use Authorization (EUA). This EUA will remain  in effect (meaning this test can be used) for the duration of the COVID-19 declaration under Section 564(b)(1) of the Act, 21 U.S.C. section 360bbb-3(b)(1), unless the authorization is terminated or revoked sooner.    Influenza A by PCR NEGATIVE NEGATIVE   Influenza B by PCR NEGATIVE NEGATIVE    Comment: (NOTE) The Xpert Xpress SARS-CoV-2/FLU/RSV assay is intended as an aid in  the diagnosis of influenza from Nasopharyngeal swab specimens and  should not be used as a sole basis for treatment. Nasal washings and  aspirates are unacceptable for Xpert Xpress SARS-CoV-2/FLU/RSV  testing. Fact Sheet  for Patients: https://www.moore.com/ Fact Sheet for Healthcare Providers: https://www.young.biz/ This test is not yet approved or cleared by the [place] FDA and  has been authorized for detection and/or diagnosis of SARS-CoV-2 by  FDA under an Emergency Use Authorization (EUA). This EUA will remain  in effect (meaning this test can be used) for the duration of the  Covid-19 declaration under Section 564(b)(1) of  the Act, 21  U.S.C. section 360bbb-3(b)(1), unless the authorization is  terminated or revoked. Performed at [facility], [address] [place], [place] [address]   Group A Strep by PCR     Status: None   Collection Time: 07/01/19  1:44 PM   Specimen: Throat; Sterile Swab  Result Value Ref Range   Group A Strep by PCR NOT DETECTED NOT DETECTED    Comment: Performed at [facility], [address] [place], [place] [address]    Imaging / Studies: CT Abdomen Pelvis W Contrast  Result Date: 07/01/2019 CLINICAL DATA:  Abdominal pain. EXAM: CT ABDOMEN AND PELVIS WITH CONTRAST TECHNIQUE: Multidetector CT imaging of the abdomen and pelvis was performed using the standard protocol following bolus administration of intravenous contrast. CONTRAST:  OMNIPAQUE IOHEXOL 300 MG/ML  SOLN COMPARISON:  None FINDINGS: Lower chest: Scarring identified within both lung bases. Hepatobiliary: No suspicious liver abnormality. Gallbladder is unremarkable. No gallbladder wall inflammation. No bile duct dilatation. Pancreas: Unremarkable. No pancreatic ductal dilatation or surrounding inflammatory changes. Spleen: Normal in size without focal abnormality. Adrenals/Urinary Tract: Normal appearance of the adrenal glands. Right upper pole kidney cyst measures 1.4 cm. Additional, less than 1 cm low-density foci are identified, too small to characterize. No suspicious enhancing mass or hydronephrosis identified bilaterally. The urinary  bladder is normal. Stomach/Bowel: Stomach is nondistended. Proximal small bowel loops are unremarkable. There is mild increase caliber of the mid small bowel loops which measure up to 2.8 cm. There is a transition point to decreased caliber distal small bowel within the right lower quadrant of the abdomen, image 46/5 and image 62/2. Unremarkable appearance of the colon. Vascular/Lymphatic: Aortic atherosclerosis. No aneurysm. No abdominopelvic adenopathy. Reproductive: Status post hysterectomy. No adnexal masses. Other: There is a small volume of free fluid identified within the left lower quadrant of the abdomen and in the dependent portion of the pelvis. No focal fluid collections identified. Musculoskeletal: No acute or significant osseous findings. IMPRESSION: 1. Increase caliber of the mid small bowel loops with transition to decreased caliber distal small bowel. Findings are concerning for low-grade partial obstruction of the small bowel. 2. Small volume of free fluid noted within the left lower quadrant of the abdomen and dependent portion of the pelvis. Aortic Atherosclerosis (ICD10-I70.0). Electronically Signed   By: Signa Kell M.D.   On: 07/01/2019 11:51    Medications / Allergies: per chart  Antibiotics: Anti-infectives (From admission, onward)   None        Note: Portions of this report may have been transcribed using voice recognition software. Every effort was made to ensure accuracy; however, inadvertent computerized transcription errors may be present.   Any transcriptional errors that result from this process are unintentional.    Ardeth Sportsman, MD, FACS, MASCRS Gastrointestinal and Minimally Invasive Surgery  [facility] [address], [address] [place], [place] [address] [phone] Main / Paging [phone] Fax     07/01/2019

## 2019-07-01 NOTE — ED Triage Notes (Signed)
Per family, pt having severe abdominal pain x 3 days.  Fever yesterday.  Poor appetite at home which is unusual for patient .  Bowels have become more loose than usual.

## 2019-07-01 NOTE — ED Provider Notes (Signed)
[facility] EMERGENCY DEPARTMENT Provider Note   CSN: [id number] Arrival date & time: 07/01/19  7616     History Chief Complaint  Patient presents with  . Abdominal Pain    Melanie Kaiser is a 69 y.o. female.  Pt presents to the ED today with abdominal pain and lack of appetite.  Pt has dementia, so her husband gives the hx.  Pt complains of abdominal pain frequently, but usually eats well.  Pt has not eaten in 3 days which is what worried the husband.  Pt had a fever last night.  He did not give her any tylenol or ibuprofen and it broke on its own.  Pt has had some loose stools, but that is not unusual for pt.  No vomiting.        Past Medical History:  Diagnosis Date  . Allergy allergic   allergic rhinitis  . 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 Depression   . Diabetes mellitus   . Hypertension     Patient Active Problem List   Diagnosis Date Noted  . SBO (small bowel obstruction) (HCC) 07/01/2019    Past Surgical History:  Procedure Laterality Date  . ABDOMINAL HYSTERECTOMY     vaginal  . BRAIN AVM REPAIR       OB History    Gravida  3   Para  3   Term  3   Preterm      AB      Living  3     SAB      TAB      Ectopic      Multiple      Live Births  3           Family History  Problem Relation Age of Onset  . Mental illness Father   . Stroke Father     Social History   Tobacco Use  . Smoking status: Never Smoker  . Smokeless tobacco: Never Used  Substance Use Topics  . Alcohol use: No  . Drug use: Never    Home Medications Prior to Admission medications   Medication Sig Start Date End Date Taking? Authorizing Provider  allopurinol (ZYLOPRIM) 100 MG tablet Take 100 mg by mouth daily. 09/17/17   [provider]  atenolol (TENORMIN) 100 MG tablet TAKE ONE TABLET BY MOUTH EVERY DAY 11/06/10   Margaree Mackintosh, MD  B Complex Vitamins (VITAMIN B COMPLEX) TABS Take 1 tablet by mouth  daily.    [provider]  CINNAMON PO Take by mouth.    [provider]  ELDERBERRY PO Take by mouth.    [provider]  fluticasone (CUTIVATE) 0.05 % cream Apply topically 2 (two) times daily. 12/31/12   Linna Hoff, MD  Melatonin 5 MG TABS Take 1 tablet by mouth at bedtime.    [provider]  metoprolol succinate (TOPROL-XL) 25 MG 24 hr tablet Take 25 mg by mouth daily. 09/17/17   [provider]  mirtazapine (REMERON) 7.5 MG tablet Take 7.5 mg by mouth at bedtime.  11/27/17   [provider]  Multiple Vitamins-Minerals (MULTIVITAMIN ADULT) TABS Take 1 tablet by mouth daily.    [provider]  NAMZARIC 28-10 MG CP24 Take 1 capsule by mouth daily. 09/17/17   [provider]  OVER THE COUNTER MEDICATION     [provider]  QUEtiapine (SEROQUEL) 25 MG tablet Take 25 mg by mouth 2 (two)  times daily. 25 mg in the morning and 50 mg at night    [provider]  sertraline (ZOLOFT) 100 MG tablet Take 150 mg by mouth daily.  09/17/17   [provider]  sertraline (ZOLOFT) 50 MG tablet TAKE ONE TABLET BY MOUTH EVERY DAY *NEED OFFICE VISIT* Patient not taking: Reported on 12/04/2017 04/23/11   Margaree Mackintosh, MD  triamterene-hydrochlorothiazide (MAXZIDE-25) 37.5-25 MG per tablet TAKE ONE TABLET BY MOUTH EVERY DAY 01/02/11   Margaree Mackintosh, MD  vitamin C (ASCORBIC ACID) 500 MG tablet Take 500 mg by mouth daily.    [provider]    Allergies    Patient has no known allergies.  Review of Systems   Review of Systems  Constitutional: Positive for appetite change.  Gastrointestinal: Positive for abdominal pain.  All other systems reviewed and are negative.   Physical Exam Updated Vital Signs BP 112/71 (BP Location: Right Arm)   Pulse 74   Temp 98.4 F (36.9 C) (Oral)   Resp 18   Ht 5\' 4"  (1.626 m)   Wt 77.1 kg   SpO2 98%   BMI 29.18 kg/m   Physical Exam Vitals and nursing note  reviewed.  Constitutional:      Appearance: She is well-developed.  HENT:     Head: Normocephalic and atraumatic.     Mouth/Throat:     Mouth: Mucous membranes are dry.     Pharynx: Oropharynx is clear.  Eyes:     Extraocular Movements: Extraocular movements intact.     Pupils: Pupils are equal, round, and reactive to light.  Cardiovascular:     Rate and Rhythm: Regular rhythm. Tachycardia present.     Heart sounds: Normal heart sounds.  Pulmonary:     Effort: Pulmonary effort is normal.     Breath sounds: Normal breath sounds.  Abdominal:     General: Abdomen is flat. Bowel sounds are normal.     Palpations: Abdomen is soft.     Tenderness: There is generalized abdominal tenderness.  Skin:    General: Skin is warm.     Capillary Refill: Capillary refill takes less than 2 seconds.  Neurological:     General: No focal deficit present.     Mental Status: She is alert. Mental status is at baseline. She is disoriented.  Psychiatric:        Mood and Affect: Mood normal.        Behavior: Behavior normal.     ED Results / Procedures / Treatments   Labs (all labs ordered are listed, but only abnormal results are displayed) Labs Reviewed  CBC WITH DIFFERENTIAL/PLATELET - Abnormal; Notable for the following components:      Result Value   WBC 13.8 (*)    Neutro Abs 10.0 (*)    All other components within normal limits  COMPREHENSIVE METABOLIC PANEL - Abnormal; Notable for the following components:   Sodium 134 (*)    Glucose, Bld 123 (*)    Creatinine, Ser 1.05 (*)    Calcium 10.6 (*)    Total Protein 8.6 (*)    GFR calc non Af Amer 54 (*)    All other components within normal limits  URINALYSIS, ROUTINE W REFLEX MICROSCOPIC - Abnormal; Notable for the following components:   Color, Urine STRAW (*)    Specific Gravity, Urine <1.005 (*)    All other components within normal limits  RESPIRATORY PANEL BY RT PCR (FLU A&B, COVID)  GROUP A STREP BY  PCR  LIPASE, BLOOD     EKG None  Radiology CT Abdomen Pelvis W Contrast  Result Date: 07/01/2019 CLINICAL DATA:  Abdominal pain. EXAM: CT ABDOMEN AND PELVIS WITH CONTRAST TECHNIQUE: Multidetector CT imaging of the abdomen and pelvis was performed using the standard protocol following bolus administration of intravenous contrast. CONTRAST:  166mL OMNIPAQUE IOHEXOL 300 MG/ML  SOLN COMPARISON:  None FINDINGS: Lower chest: Scarring identified within both lung bases. Hepatobiliary: No suspicious liver abnormality. Gallbladder is unremarkable. No gallbladder wall inflammation. No bile duct dilatation. Pancreas: Unremarkable. No pancreatic ductal dilatation or surrounding inflammatory changes. Spleen: Normal in size without focal abnormality. Adrenals/Urinary Tract: Normal appearance of the adrenal glands. Right upper pole kidney cyst measures 1.4 cm. Additional, less than 1 cm low-density foci are identified, too small to characterize. No suspicious enhancing mass or hydronephrosis identified bilaterally. The urinary bladder is normal. Stomach/Bowel: Stomach is nondistended. Proximal small bowel loops are unremarkable. There is mild increase caliber of the mid small bowel loops which measure up to 2.8 cm. There is a transition point to decreased caliber distal small bowel within the right lower quadrant of the abdomen, image 46/5 and image 62/2. Unremarkable appearance of the colon. Vascular/Lymphatic: Aortic atherosclerosis. No aneurysm. No abdominopelvic adenopathy. Reproductive: Status post hysterectomy. No adnexal masses. Other: There is a small volume of free fluid identified within the left lower quadrant of the abdomen and in the dependent portion of the pelvis. No focal fluid collections identified. Musculoskeletal: No acute or significant osseous findings. IMPRESSION: 1. Increase caliber of the mid small bowel loops with transition to decreased caliber distal small bowel. Findings are concerning for low-grade partial  obstruction of the small bowel. 2. Small volume of free fluid noted within the left lower quadrant of the abdomen and dependent portion of the pelvis. Aortic Atherosclerosis (ICD10-I70.0). Electronically Signed   By: Kerby Moors M.D.   On: 07/01/2019 11:51    Procedures Procedures (including critical care time)  Medications Ordered in ED Medications  ondansetron (ZOFRAN) injection 4 mg (4 mg Intravenous Given 07/01/19 1006)  sodium chloride 0.9 % bolus 1,000 mL (0 mLs Intravenous Stopped 07/01/19 1106)  iohexol (OMNIPAQUE) 300 MG/ML solution 100 mL (100 mLs Intravenous Contrast Given 07/01/19 1125)  LORazepam (ATIVAN) injection 1 mg (1 mg Intravenous Given 07/01/19 1303)    ED Course  I have reviewed the triage vital signs and the nursing notes.  Pertinent labs & imaging results that were available during my care of the patient were reviewed by me and considered in my medical decision making (see chart for details).    MDM Rules/Calculators/A&P                      Pt is still having some abdominal pain, but is looking better.  No source for fever found, but Covid swab is pending.  Pt's daughter said pt has been exposed to strep, so a strep swab has also been ordered.  Pt d/w Dr. Harlow Asa (surgery).  He will consult.    Pt d/w Dr. Waldron [facility] (triad) for admission.  Final Clinical Impression(s) / ED Diagnoses Final diagnoses:  Partial small bowel obstruction ([place])  Dehydration    Rx / DC Orders ED Discharge Orders    None       Isla Pence, MD 07/01/19 1331

## 2019-07-02 ENCOUNTER — Inpatient Hospital Stay (HOSPITAL_COMMUNITY): Payer: Medicare PPO

## 2019-07-02 DIAGNOSIS — K56600 Partial intestinal obstruction, unspecified as to cause: Secondary | ICD-10-CM | POA: Diagnosis not present

## 2019-07-02 DIAGNOSIS — G300 Alzheimer's disease with early onset: Secondary | ICD-10-CM | POA: Diagnosis not present

## 2019-07-02 DIAGNOSIS — E860 Dehydration: Secondary | ICD-10-CM | POA: Diagnosis not present

## 2019-07-02 DIAGNOSIS — K56609 Unspecified intestinal obstruction, unspecified as to partial versus complete obstruction: Secondary | ICD-10-CM | POA: Diagnosis present

## 2019-07-02 DIAGNOSIS — I10 Essential (primary) hypertension: Secondary | ICD-10-CM | POA: Diagnosis not present

## 2019-07-02 DIAGNOSIS — Z0189 Encounter for other specified special examinations: Secondary | ICD-10-CM

## 2019-07-02 LAB — COMPREHENSIVE METABOLIC PANEL
ALT: 15 U/L (ref 0–44)
AST: 18 U/L (ref 15–41)
Albumin: 4 g/dL (ref 3.5–5.0)
Alkaline Phosphatase: 54 U/L (ref 38–126)
Anion gap: 7 (ref 5–15)
BUN: 21 mg/dL (ref 8–23)
CO2: 28 mmol/L (ref 22–32)
Calcium: 9.2 mg/dL (ref 8.9–10.3)
Chloride: 106 mmol/L (ref 98–111)
Creatinine, Ser: 0.95 mg/dL (ref 0.44–1.00)
GFR calc Af Amer: >60 mL/min (ref >60)
GFR calc non Af Amer: >60 mL/min (ref >60)
Glucose, Bld: 100 mg/dL — ABNORMAL HIGH (ref 70–99)
Potassium: 3.6 mmol/L (ref 3.5–5.1)
Sodium: 141 mmol/L (ref 135–145)
Total Bilirubin: 0.9 mg/dL (ref 0.3–1.2)
Total Protein: 7.1 g/dL (ref 6.5–8.1)

## 2019-07-02 LAB — CBC
HCT: 37.5 % (ref 36.0–46.0)
Hemoglobin: 11.6 g/dL — ABNORMAL LOW (ref 12.0–15.0)
MCH: 27.8 pg (ref 26.0–34.0)
MCHC: 30.9 g/dL (ref 30.0–36.0)
MCV: 89.9 fL (ref 80.0–100.0)
Platelets: 188 10*3/uL (ref 150–400)
RBC: 4.17 MIL/uL (ref 3.87–5.11)
RDW: 14.6 % (ref 11.5–15.5)
WBC: 9.7 10*3/uL (ref 4.0–10.5)
nRBC: 0 % (ref 0.0–0.2)

## 2019-07-02 LAB — GLUCOSE, CAPILLARY
Glucose-Capillary: 87 mg/dL (ref 70–99)
Glucose-Capillary: 101 mg/dL — ABNORMAL HIGH (ref 70–99)

## 2019-07-02 MED ORDER — NAPHAZOLINE-GLYCERIN 0.012-0.2 % OP SOLN
1.0000 [drp] | Freq: Four times a day (QID) | OPHTHALMIC | Status: DC | PRN
  Filled 2019-07-02: qty 15

## 2019-07-02 NOTE — Discharge Summary (Signed)
Physician Discharge Summary  GEANA WALTS UXN:[id number] DOB: [birth year] DOA: 07/01/2019  PCP: Cari Caraway, MD  Admit date: 07/01/2019 Discharge date: 07/02/2019  Admitted From: Home Disposition: Home  Recommendations for Outpatient Follow-up:  1. Follow up with PCP in 1-2 weeks  Home Health: None Equipment/Devices: None  Discharge Condition: Stable CODE STATUS: Full code Diet recommendation: Regular  HPI: Per admitting MD, CUBA NATARAJAN is a 69 y.o. female with medical history significant of significant dementia, hypertension, prediabetes, who comes to the hospital brought by the family due to complaints of abdominal pain.  Patient has dementia and she is unable to give a coherent story so HPI is per husband who is at bedside.  He tells me that she has been having low degree of chronic abdominal pain for a number of years, however in the last 4 days she has been having severe abdominal pain, continuous, and crying at times.  He denies patient having any episodes of vomiting, but he recalls seeing her gagging/nauseous, and over the last 3 days she has been refusing any food.  There are no other reported complaints, no chest pain, shortness of breath.  There is a reported fever yesterday.  Also hospital reports some loose stools but not unusual for her. ED Course: In the emergency room patient is afebrile 98.4, heart rate is around 100, she is normotensive and satting well on room air.  Her blood work reveals a sodium of 134, glucose 123, creatinine 1.0, WBC 15.8.  SARS-CoV-2 is negative.  Urinalysis is unremarkable.  CT scan of the abdomen and pelvis showed low-grade partial obstruction of the small bowel.  General surgery was consulted and patient was admitted to the hospital.   Hospital Course / Discharge diagnoses: Principal Problem Small bowel obstruction -patient was admitted to the hospital with small bowel obstruction, she was managed conservatively with IV fluids and  n.p.o. status. General surgery was consulted and followed patient while hospitalized. An NG tube was not placed due to the fact that patient has had no vomiting at home.  She improved with conservative management, and by hospital day 2, somewhat earlier than anticipated, her small bowel obstruction has completely resolved, she started having bowel movements, had no abdominal pain and she was able to tolerate a regular diet.  She will be discharged home in stable condition.  Active Problems Underlying dementia -continue home medications on discharge Mild hyponatremia-Likely due to dehydration, sodium levels normalized with fluids Prediabetes -diet controlled Leukocytosis -Likely in the setting of #1, no evidence of bacterial infection, white count has normalized Essential hypertension-continue home medications Depression -continue home medications  Discharge Instructions   Allergies as of 07/02/2019   No Known Allergies     Medication List    STOP taking these medications   atenolol 100 MG tablet Commonly known as: TENORMIN   fluticasone 0.05 % cream Commonly known as: Cutivate   sertraline 50 MG tablet Commonly known as: ZOLOFT     TAKE these medications   CINNAMON PO Take by mouth.   CoQ10 100 MG Caps Take 100 mg by mouth daily.   ELDERBERRY PO Take by mouth.   ferrous sulfate 325 (65 FE) MG EC tablet Take 325 mg by mouth daily with breakfast.   metoprolol succinate 25 MG 24 hr tablet Commonly known as: TOPROL-XL Take 25 mg by mouth daily.   Multivitamin Adult Tabs Take 1 tablet by mouth daily.   OVER THE COUNTER MEDICATION Take 1 tablet by mouth daily. Mega  Red   QUEtiapine 50 MG tablet Commonly known as: SEROQUEL Take 25-50 mg by mouth See admin instructions. 25mg  in am 50mg  hs   triamterene-hydrochlorothiazide 37.5-25 MG tablet Commonly known as: MAXZIDE-25 TAKE ONE TABLET BY MOUTH EVERY DAY   Vitamin B Complex Tabs Take 1 tablet by mouth daily.     vitamin C 500 MG tablet Commonly known as: ASCORBIC ACID Take 500 mg by mouth daily.   Vitamin D-3 25 MCG (1000 UT) Caps Take 1,000 Units by mouth daily.   vitamin E 1000 UNIT capsule Take 1,000 Units by mouth daily.      Consultations:  General surgery   Procedures/Studies:  CT Abdomen Pelvis W Contrast  Result Date: 07/01/2019 CLINICAL DATA:  Abdominal pain. EXAM: CT ABDOMEN AND PELVIS WITH CONTRAST TECHNIQUE: Multidetector CT imaging of the abdomen and pelvis was performed using the standard protocol following bolus administration of intravenous contrast. CONTRAST:  02-02-1992 OMNIPAQUE IOHEXOL 300 MG/ML  SOLN COMPARISON:  None FINDINGS: Lower chest: Scarring identified within both lung bases. Hepatobiliary: No suspicious liver abnormality. Gallbladder is unremarkable. No gallbladder wall inflammation. No bile duct dilatation. Pancreas: Unremarkable. No pancreatic ductal dilatation or surrounding inflammatory changes. Spleen: Normal in size without focal abnormality. Adrenals/Urinary Tract: Normal appearance of the adrenal glands. Right upper pole kidney cyst measures 1.4 cm. Additional, less than 1 cm low-density foci are identified, too small to characterize. No suspicious enhancing mass or hydronephrosis identified bilaterally. The urinary bladder is normal. Stomach/Bowel: Stomach is nondistended. Proximal small bowel loops are unremarkable. There is mild increase caliber of the mid small bowel loops which measure up to 2.8 cm. There is a transition point to decreased caliber distal small bowel within the right lower quadrant of the abdomen, image 46/5 and image 62/2. Unremarkable appearance of the colon. Vascular/Lymphatic: Aortic atherosclerosis. No aneurysm. No abdominopelvic adenopathy. Reproductive: Status post hysterectomy. No adnexal masses. Other: There is a small volume of free fluid identified within the left lower quadrant of the abdomen and in the dependent portion of the pelvis. No  focal fluid collections identified. Musculoskeletal: No acute or significant osseous findings. IMPRESSION: 1. Increase caliber of the mid small bowel loops with transition to decreased caliber distal small bowel. Findings are concerning for low-grade partial obstruction of the small bowel. 2. Small volume of free fluid noted within the left lower quadrant of the abdomen and dependent portion of the pelvis. Aortic Atherosclerosis (ICD10-I70.0). Electronically Signed   By: 08/31/2019 M.D.   On: 07/01/2019 11:51   DG Abd Portable 1V-Small Bowel Obstruction Protocol-initial, 8 hr delay  Result Date: 07/02/2019 CLINICAL DATA:  Small bowel obstruction, 8 hour delayed film. EXAM: PORTABLE ABDOMEN - 1 VIEW COMPARISON:  CT yesterday. FINDINGS: There is enteric contrast in the ascending, transverse, descending and sigmoid colon. Small amount of residual contrast also within the small bowel. Excreted IV contrast in the urinary bladder. No evidence of free air. IMPRESSION: Enteric contrast has reached the ascending, transverse, descending and sigmoid colon. Electronically Signed   By: 08/31/2019 M.D.   On: 07/02/2019 03:37     Subjective: - no chest pain, shortness of breath, no abdominal pain, nausea or vomiting.   Discharge Exam: BP 122/76 (BP Location: Left Arm)   Pulse 79   Temp 98.6 F (37 C) (Oral)   Resp 19   Ht 5\' 4"  (1.626 m)   Wt 77.1 kg   SpO2 98%   BMI 29.18 kg/m   General: Pt is alert, awake, not in  acute distress Cardiovascular: RRR, S1/S2 +, no rubs, no gallops Respiratory: CTA bilaterally, no wheezing, no rhonchi Abdominal: Soft, NT, ND, bowel sounds + Extremities: no edema, no cyanosis    The results of significant diagnostics from this hospitalization (including imaging, microbiology, ancillary and laboratory) are listed below for reference.     Microbiology: Recent Results (from the past 240 hour(s))  Respiratory Panel by RT PCR (Flu A&B, Covid) - Nasopharyngeal  Swab     Status: None   Collection Time: 07/01/19 12:55 PM   Specimen: Nasopharyngeal Swab  Result Value Ref Range Status   SARS Coronavirus 2 by RT PCR NEGATIVE NEGATIVE Final    Comment: (NOTE) SARS-CoV-2 target nucleic acids are NOT DETECTED. The SARS-CoV-2 RNA is generally detectable in upper respiratoy specimens during the acute phase of infection. The lowest concentration of SARS-CoV-2 viral copies this assay can detect is 131 copies/mL. A negative result does not preclude SARS-Cov-2 infection and should not be used as the sole basis for treatment or other patient management decisions. A negative result may occur with  improper specimen collection/handling, submission of specimen other than nasopharyngeal swab, presence of viral mutation(s) within the areas targeted by this assay, and inadequate number of viral copies (<131 copies/mL). A negative result must be combined with clinical observations, patient history, and epidemiological information. The expected result is Negative. Fact Sheet for Patients:  https://www.moore.com/ Fact Sheet for Healthcare Providers:  https://www.young.biz/ This test is not yet ap proved or cleared by the [place] FDA and  has been authorized for detection and/or diagnosis of SARS-CoV-2 by FDA under an Emergency Use Authorization (EUA). This EUA will remain  in effect (meaning this test can be used) for the duration of the COVID-19 declaration under Section 564(b)(1) of the Act, 21 U.S.C. section 360bbb-3(b)(1), unless the authorization is terminated or revoked sooner.    Influenza A by PCR NEGATIVE NEGATIVE Final   Influenza B by PCR NEGATIVE NEGATIVE Final    Comment: (NOTE) The Xpert Xpress SARS-CoV-2/FLU/RSV assay is intended as an aid in  the diagnosis of influenza from Nasopharyngeal swab specimens and  should not be used as a sole basis for treatment. Nasal washings and  aspirates are  unacceptable for Xpert Xpress SARS-CoV-2/FLU/RSV  testing. Fact Sheet for Patients: https://www.moore.com/ Fact Sheet for Healthcare Providers: https://www.young.biz/ This test is not yet approved or cleared by the [place] FDA and  has been authorized for detection and/or diagnosis of SARS-CoV-2 by  FDA under an Emergency Use Authorization (EUA). This EUA will remain  in effect (meaning this test can be used) for the duration of the  Covid-19 declaration under Section 564(b)(1) of the Act, 21  U.S.C. section 360bbb-3(b)(1), unless the authorization is  terminated or revoked. Performed at [facility], [address] [place], [place] [address]   Group A Strep by PCR     Status: None   Collection Time: 07/01/19  1:44 PM   Specimen: Throat; Sterile Swab  Result Value Ref Range Status   Group A Strep by PCR NOT DETECTED NOT DETECTED Final    Comment: Performed at [facility], [address] [place], [place] [address]     Labs: Basic Metabolic Panel: Recent Labs  Lab 07/01/19 1001 07/02/19 0555  NA 134* 141  K 3.7 3.6  CL 98 106  CO2 24 28  GLUCOSE 123* 100*  BUN 20 21  CREATININE 1.05* 0.95  CALCIUM 10.6* 9.2   Liver Function Tests:  Recent Labs  Lab 07/01/19 1001 07/02/19 0555  AST 24 18  ALT 15 15  ALKPHOS 68 54  BILITOT 1.2 0.9  PROT 8.6* 7.1  ALBUMIN 4.7 4.0   CBC: Recent Labs  Lab 07/01/19 1001 07/02/19 0555  WBC 13.8* 9.7  NEUTROABS 10.0*  --   HGB 14.3 11.6*  HCT 43.5 37.5  MCV 86.5 89.9  PLT 233 188   CBG: Recent Labs  Lab 07/01/19 1709 07/02/19 0752 07/02/19 1152  GLUCAP 104* 87 101*   Hgb A1c Recent Labs    07/01/19 1648  HGBA1C 5.8*   Lipid Profile No results for input(s): CHOL, HDL, LDLCALC, TRIG, CHOLHDL, LDLDIRECT in the last 72 hours. Thyroid function studies No results for input(s): TSH, T4TOTAL, T3FREE, THYROIDAB in the last 72 hours.  Invalid input(s):  FREET3 Urinalysis    Component Value Date/Time   COLORURINE STRAW (A) 07/01/2019 1154   APPEARANCEUR CLEAR 07/01/2019 1154   LABSPEC <1.005 (L) 07/01/2019 1154   PHURINE 6.5 07/01/2019 1154   GLUCOSEU NEGATIVE 07/01/2019 1154   HGBUR NEGATIVE 07/01/2019 1154   BILIRUBINUR NEGATIVE 07/01/2019 1154   KETONESUR NEGATIVE 07/01/2019 1154   PROTEINUR NEGATIVE 07/01/2019 1154   NITRITE NEGATIVE 07/01/2019 1154   LEUKOCYTESUR NEGATIVE 07/01/2019 1154    FURTHER DISCHARGE INSTRUCTIONS:   Get Medicines reviewed and adjusted: Please take all your medications with you for your next visit with your Primary MD   Laboratory/radiological data: Please request your Primary MD to go over all hospital tests and procedure/radiological results at the follow up, please ask your Primary MD to get all Hospital records sent to his/her office.   In some cases, they will be blood work, cultures and biopsy results pending at the time of your discharge. Please request that your primary care M.D. goes through all the records of your hospital data and follows up on these results.   Also Note the following: If you experience worsening of your admission symptoms, develop shortness of breath, life threatening emergency, suicidal or homicidal thoughts you must seek medical attention immediately by calling 911 or calling your MD immediately  if symptoms less severe.   You must read complete instructions/literature along with all the possible adverse reactions/side effects for all the Medicines you take and that have been prescribed to you. Take any new Medicines after you have completely understood and accpet all the possible adverse reactions/side effects.    Do not drive when taking Pain medications or sleeping medications (Benzodaizepines)   Do not take more than prescribed Pain, Sleep and Anxiety Medications. It is not advisable to combine anxiety,sleep and pain medications without talking with your primary care  practitioner   Special Instructions: If you have smoked or chewed Tobacco  in the last 2 yrs please stop smoking, stop any regular Alcohol  and or any Recreational drug use.   Wear Seat belts while driving.   Please note: You were cared for by a hospitalist during your hospital stay. Once you are discharged, your primary care physician will handle any further medical issues. Please note that NO REFILLS for any discharge medications will be authorized once you are discharged, as it is imperative that you return to your primary care physician (or establish a relationship with a primary care physician if you do not have one) for your post hospital discharge needs so that they can reassess your need for medications and monitor your lab values.  Time coordinating discharge: 40 minutes  SIGNED:  Pamella Pert, MD, PhD  07/02/2019, 2:00 PM

## 2019-07-02 NOTE — Progress Notes (Signed)
Subjective No acute events. Denies any issues overnight. Denies n/v. Unsure if gas or BM. I suspect she is actively passing gas based on room.  Objective: Vital signs in last 24 hours: Temp:  [98 F (36.7 C)-99 F (37.2 C)] 98.6 F (37 C) (03/06 0526) Pulse Rate:  [74-111] 79 (03/06 0526) Resp:  [16-19] 19 (03/06 0526) BP: (112-147)/(71-89) 122/76 (03/06 0526) SpO2:  [97 %-100 %] 98 % (03/06 0526) Weight:  [77.1 kg] 77.1 kg (03/05 0932)    Intake/Output from previous day: 03/05 0701 - 03/06 0700 In: 786.7 [I.V.:786.7] Out: 425 [Urine:425] Intake/Output this shift: No intake/output data recorded.  Gen: NAD, comfortable CV: RRR Pulm: Normal work of breathing Abd: Soft, nontender, nondistended. No rebound. No guarding Ext: SCDs in place  Lab Results: CBC  Recent Labs    07/01/19 1001 07/02/19 0555  WBC 13.8* 9.7  HGB 14.3 11.6*  HCT 43.5 37.5  PLT 233 188   BMET Recent Labs    07/01/19 1001 07/02/19 0555  NA 134* 141  K 3.7 3.6  CL 98 106  CO2 24 28  GLUCOSE 123* 100*  BUN 20 21  CREATININE 1.05* 0.95  CALCIUM 10.6* 9.2   PT/INR No results for input(s): LABPROT, INR in the last 72 hours. ABG No results for input(s): PHART, HCO3 in the last 72 hours.  Invalid input(s): PCO2, PO2  Studies/Results:  Anti-infectives: Anti-infectives (From admission, onward)   None       Assessment/Plan: Patient Active Problem List   Diagnosis Date Noted  . Nausea with vomiting 07/01/2019  . Diarrhea 07/01/2019  . Abdominal pain 07/01/2019  . Diet-controlled type 2 diabetes mellitus (HCC) 10/22/2018  . 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 Gout 04/03/2017  . Allergic rhinitis due to other allergen 04/23/2006  . Benign essential hypertension 04/23/2006  . Depressive disorder, not elsewhere classified 04/23/2006  . Malformation 04/23/2006  . Paresthesia 04/23/2006   Ileus vs pSBO, appears  resolved  Delayed film shows contrast in colon Clinically does not appear to be obstructed at this time and her exam is very reassuring Will advance her to soft diet If tolerates, ok with discharge later today vs tomorrow   LOS: 1 day   Stephanie Coup. Cliffton Asters, M.D. [facility] Use [email or web address] to contact on call provider

## 2019-07-02 NOTE — Care Management Obs Status (Signed)
MEDICARE OBSERVATION STATUS NOTIFICATION   Patient Details  Name: MACKLYN GLANDON MRN: [id number] Date of Birth: June 06, 1950   Medicare Observation Status Notification Given:  Yes    Armanda Heritage, RN 07/02/2019, 2:32 PM

## 2019-07-02 NOTE — Progress Notes (Signed)
Pt left with spouse at this time. Spouse aware of discharge instructions; has verbalized understanding.

## 2019-07-02 NOTE — Care Management CC44 (Signed)
Condition Code 44 Documentation Completed  Patient Details  Name: Melanie Kaiser MRN: [id number] Date of Birth: January 08, 1951   Condition Code 44 given:  Yes Patient signature on Condition Code 44 notice:  Yes Documentation of 2 MD's agreement:  Yes Code 44 added to claim:  Yes    Armanda Heritage, RN 07/02/2019, 2:32 PM

## 2019-07-02 NOTE — Discharge Instructions (Signed)
Follow with Melanie Dimitri, MD in 1-2 weeks  Please get a complete blood count and chemistry panel checked by your Primary MD at your next visit, and again as instructed by your Primary MD. Please get your medications reviewed and adjusted by your Primary MD.  Please request your Primary MD to go over all Hospital Tests and Procedure/Radiological results at the follow up, please get all Hospital records sent to your Prim MD by signing hospital release before you go home.  In some cases, there will be blood work, cultures and biopsy results pending at the time of your discharge. Please request that your primary care M.D. goes through all the records of your hospital data and follows up on these results.  If you had Pneumonia of Lung problems at the Hospital: Please get a 2 view Chest X ray done in 6-8 weeks after hospital discharge or sooner if instructed by your Primary MD.  If you have Congestive Heart Failure: Please call your Cardiologist or Primary MD anytime you have any of the following symptoms:  1) 3 pound weight gain in 24 hours or 5 pounds in 1 week  2) shortness of breath, with or without a dry hacking cough  3) swelling in the hands, feet or stomach  4) if you have to sleep on extra pillows at night in order to breathe  Follow cardiac low salt diet and 1.5 lit/day fluid restriction.  If you have diabetes Accuchecks 4 times/day, Once in AM empty stomach and then before each meal. Log in all results and show them to your primary doctor at your next visit. If any glucose reading is under 80 or above 300 call your primary MD immediately.  If you have Seizure/Convulsions/Epilepsy: Please do not drive, operate heavy machinery, participate in activities at heights or participate in high speed sports until you have seen by Primary MD or a Neurologist and advised to do so again. Per [facility] statutes, patients with seizures are not allowed to drive until they have been  seizure-free for six months.  Use caution when using heavy equipment or power tools. Avoid working on ladders or at heights. Take showers instead of baths. Ensure the water temperature is not too high on the home water heater. Do not go swimming alone. Do not lock yourself in a room alone (i.e. bathroom). When caring for infants or small children, sit down when holding, feeding, or changing them to minimize risk of injury to the child in the event you have a seizure. Maintain good sleep hygiene. Avoid alcohol.   If you had Gastrointestinal Bleeding: Please ask your Primary MD to check a complete blood count within one week of discharge or at your next visit. Your endoscopic/colonoscopic biopsies that are pending at the time of discharge, will also need to followed by your Primary MD.  Get Medicines reviewed and adjusted. Please take all your medications with you for your next visit with your Primary MD  Please request your Primary MD to go over all hospital tests and procedure/radiological results at the follow up, please ask your Primary MD to get all Hospital records sent to his/her office.  If you experience worsening of your admission symptoms, develop shortness of breath, life threatening emergency, suicidal or homicidal thoughts you must seek medical attention immediately by calling 911 or calling your MD immediately  if symptoms less severe.  You must read complete instructions/literature along with all the possible adverse reactions/side effects for all the Medicines you take  and that have been prescribed to you. Take any new Medicines after you have completely understood and accpet all the possible adverse reactions/side effects.   Do not drive or operate heavy machinery when taking Pain medications.   Do not take more than prescribed Pain, Sleep and Anxiety Medications  Special Instructions: If you have smoked or chewed Tobacco  in the last 2 yrs please stop smoking, stop any regular  Alcohol  and or any Recreational drug use.  Wear Seat belts while driving.  Please note You were cared for by a hospitalist during your hospital stay. If you have any questions about your discharge medications or the care you received while you were in the hospital after you are discharged, you can call the unit and asked to speak with the hospitalist on call if the hospitalist that took care of you is not available. Once you are discharged, your primary care physician will handle any further medical issues. Please note that NO REFILLS for any discharge medications will be authorized once you are discharged, as it is imperative that you return to your primary care physician (or establish a relationship with a primary care physician if you do not have one) for your aftercare needs so that they can reassess your need for medications and monitor your lab values.  You can reach the hospitalist office at phone [phone] or fax [phone]   If you do not have a primary care physician, you can call [phone] for a physician referral.  Activity: As tolerated with Full fall precautions use walker/cane & assistance as needed    Diet: regular  Disposition Home

## 2019-07-04 NOTE — Progress Notes (Signed)
Place in observation (patient's expected length of stay will be less than 2 midnights) (Order [id number]) Admission Date: 07/02/2019 Department: [facility] Released By/Authorizing: Leatha Gilding, MD (auto-released)

## 2019-09-18 ENCOUNTER — Emergency Department (HOSPITAL_BASED_OUTPATIENT_CLINIC_OR_DEPARTMENT_OTHER): Payer: Medicare PPO

## 2019-09-18 ENCOUNTER — Other Ambulatory Visit: Payer: Self-pay

## 2019-09-18 ENCOUNTER — Inpatient Hospital Stay (HOSPITAL_BASED_OUTPATIENT_CLINIC_OR_DEPARTMENT_OTHER)
Admission: EM | Admit: 2019-09-18 | Discharge: 2019-09-21 | DRG: 389 | Disposition: A | Discharge status: Home / Self Care | Payer: Medicare PPO | Attending: Internal Medicine | Admitting: Internal Medicine

## 2019-09-18 ENCOUNTER — Encounter (HOSPITAL_BASED_OUTPATIENT_CLINIC_OR_DEPARTMENT_OTHER): Payer: Self-pay | Admitting: Emergency Medicine

## 2019-09-18 DIAGNOSIS — F329 Major depressive disorder, single episode, unspecified: Secondary | ICD-10-CM | POA: Diagnosis present

## 2019-09-18 DIAGNOSIS — F02818 Dementia in other diseases classified elsewhere, unspecified severity, with other behavioral disturbance: Secondary | ICD-10-CM | POA: Diagnosis present

## 2019-09-18 DIAGNOSIS — E119 Type 2 diabetes mellitus without complications: Secondary | ICD-10-CM | POA: Diagnosis present

## 2019-09-18 DIAGNOSIS — R7989 Other specified abnormal findings of blood chemistry: Secondary | ICD-10-CM | POA: Diagnosis not present

## 2019-09-18 DIAGNOSIS — G300 Alzheimer's disease with early onset: Secondary | ICD-10-CM | POA: Diagnosis present

## 2019-09-18 DIAGNOSIS — K529 Noninfective gastroenteritis and colitis, unspecified: Secondary | ICD-10-CM | POA: Diagnosis not present

## 2019-09-18 DIAGNOSIS — Z818 Family history of other mental and behavioral disorders: Secondary | ICD-10-CM

## 2019-09-18 DIAGNOSIS — D72829 Elevated white blood cell count, unspecified: Secondary | ICD-10-CM | POA: Diagnosis present

## 2019-09-18 DIAGNOSIS — I10 Essential (primary) hypertension: Secondary | ICD-10-CM | POA: Diagnosis present

## 2019-09-18 DIAGNOSIS — F0281 Dementia in other diseases classified elsewhere with behavioral disturbance: Secondary | ICD-10-CM | POA: Diagnosis present

## 2019-09-18 DIAGNOSIS — Z20822 Contact with and (suspected) exposure to covid-19: Secondary | ICD-10-CM | POA: Diagnosis present

## 2019-09-18 DIAGNOSIS — K56600 Partial intestinal obstruction, unspecified as to cause: Secondary | ICD-10-CM | POA: Diagnosis not present

## 2019-09-18 DIAGNOSIS — R7401 Elevation of levels of liver transaminase levels: Secondary | ICD-10-CM | POA: Diagnosis present

## 2019-09-18 DIAGNOSIS — Z79899 Other long term (current) drug therapy: Secondary | ICD-10-CM

## 2019-09-18 DIAGNOSIS — K5669 Other partial intestinal obstruction: Principal | ICD-10-CM | POA: Diagnosis present

## 2019-09-18 DIAGNOSIS — M109 Gout, unspecified: Secondary | ICD-10-CM | POA: Diagnosis present

## 2019-09-18 DIAGNOSIS — E876 Hypokalemia: Secondary | ICD-10-CM | POA: Diagnosis present

## 2019-09-18 DIAGNOSIS — K567 Ileus, unspecified: Secondary | ICD-10-CM

## 2019-09-18 LAB — LACTIC ACID, PLASMA: Lactic Acid, Venous: 1.6 mmol/L (ref 0.5–1.9)

## 2019-09-18 LAB — URINALYSIS, ROUTINE W REFLEX MICROSCOPIC
Bilirubin Urine: NEGATIVE
Glucose, UA: NEGATIVE mg/dL
Hgb urine dipstick: NEGATIVE
Ketones, ur: NEGATIVE mg/dL
Leukocytes,Ua: NEGATIVE
Nitrite: NEGATIVE
Protein, ur: NEGATIVE mg/dL
Specific Gravity, Urine: <1.005 — ABNORMAL LOW (ref 1.005–1.030)
pH: 6 (ref 5.0–8.0)

## 2019-09-18 LAB — CBC WITH DIFFERENTIAL/PLATELET
Abs Immature Granulocytes: 0.06 10*3/uL (ref 0.00–0.07)
Basophils Absolute: 0 10*3/uL (ref 0.0–0.1)
Basophils Relative: 0 %
Eosinophils Absolute: 0 10*3/uL (ref 0.0–0.5)
Eosinophils Relative: 0 %
HCT: 39.6 % (ref 36.0–46.0)
Hemoglobin: 12.7 g/dL (ref 12.0–15.0)
Immature Granulocytes: 0 %
Lymphocytes Relative: 9 %
Lymphs Abs: 1.2 10*3/uL (ref 0.7–4.0)
MCH: 27.7 pg (ref 26.0–34.0)
MCHC: 32.1 g/dL (ref 30.0–36.0)
MCV: 86.3 fL (ref 80.0–100.0)
Monocytes Absolute: 0.4 10*3/uL (ref 0.1–1.0)
Monocytes Relative: 3 %
Neutro Abs: 12.2 10*3/uL — ABNORMAL HIGH (ref 1.7–7.7)
Neutrophils Relative %: 88 %
Platelets: 209 10*3/uL (ref 150–400)
RBC: 4.59 MIL/uL (ref 3.87–5.11)
RDW: 15.5 % (ref 11.5–15.5)
WBC: 13.9 10*3/uL — ABNORMAL HIGH (ref 4.0–10.5)
nRBC: 0 % (ref 0.0–0.2)

## 2019-09-18 LAB — COMPREHENSIVE METABOLIC PANEL
ALT: 74 U/L — ABNORMAL HIGH (ref 0–44)
AST: 67 U/L — ABNORMAL HIGH (ref 15–41)
Albumin: 5.1 g/dL — ABNORMAL HIGH (ref 3.5–5.0)
Alkaline Phosphatase: 78 U/L (ref 38–126)
Anion gap: 14 (ref 5–15)
BUN: 16 mg/dL (ref 8–23)
CO2: 25 mmol/L (ref 22–32)
Calcium: 9.7 mg/dL (ref 8.9–10.3)
Chloride: 98 mmol/L (ref 98–111)
Creatinine, Ser: 0.86 mg/dL (ref 0.44–1.00)
GFR calc Af Amer: >60 mL/min (ref >60)
GFR calc non Af Amer: >60 mL/min (ref >60)
Glucose, Bld: 127 mg/dL — ABNORMAL HIGH (ref 70–99)
Potassium: 4.7 mmol/L (ref 3.5–5.1)
Sodium: 137 mmol/L (ref 135–145)
Total Bilirubin: 1.2 mg/dL (ref 0.3–1.2)
Total Protein: 8.9 g/dL — ABNORMAL HIGH (ref 6.5–8.1)

## 2019-09-18 LAB — LIPASE, BLOOD: Lipase: 63 U/L — ABNORMAL HIGH (ref 11–51)

## 2019-09-18 LAB — GLUCOSE, CAPILLARY
Glucose-Capillary: 104 mg/dL — ABNORMAL HIGH (ref 70–99)
Glucose-Capillary: 104 mg/dL — ABNORMAL HIGH (ref 70–99)

## 2019-09-18 LAB — TROPONIN I (HIGH SENSITIVITY): Troponin I (High Sensitivity): 5 ng/L (ref <18)

## 2019-09-18 LAB — SARS CORONAVIRUS 2 BY RT PCR (HOSPITAL ORDER, PERFORMED IN ~~LOC~~ HOSPITAL LAB): SARS Coronavirus 2: NEGATIVE

## 2019-09-18 MED ORDER — MORPHINE SULFATE (PF) 4 MG/ML IV SOLN
4.0000 mg | Freq: Once | INTRAVENOUS | Status: AC
  Administered 2019-09-18: 4 mg via INTRAVENOUS
  Filled 2019-09-18: qty 1

## 2019-09-18 MED ORDER — ONDANSETRON HCL 4 MG/2ML IJ SOLN
4.0000 mg | Freq: Once | INTRAMUSCULAR | Status: AC
  Administered 2019-09-18: 4 mg via INTRAVENOUS
  Filled 2019-09-18: qty 2

## 2019-09-18 MED ORDER — SODIUM CHLORIDE 0.9 % IV SOLN: INTRAVENOUS | Status: DC

## 2019-09-18 MED ORDER — ONDANSETRON HCL 4 MG/2ML IJ SOLN
4.0000 mg | Freq: Four times a day (QID) | INTRAMUSCULAR | Status: DC | PRN
  Administered 2019-09-21: 4 mg via INTRAVENOUS
  Filled 2019-09-18: qty 2

## 2019-09-18 MED ORDER — LABETALOL HCL 5 MG/ML IV SOLN
10.0000 mg | INTRAVENOUS | Status: DC | PRN
  Filled 2019-09-18: qty 4

## 2019-09-18 MED ORDER — PROMETHAZINE HCL 25 MG/ML IJ SOLN: 6.2500 mg | Freq: Four times a day (QID) | INTRAMUSCULAR | Status: DC | PRN

## 2019-09-18 MED ORDER — INSULIN ASPART 100 UNIT/ML ~~LOC~~ SOLN: 0.0000 [IU] | SUBCUTANEOUS | Status: DC

## 2019-09-18 MED ORDER — ONDANSETRON HCL 4 MG PO TABS: 4.0000 mg | ORAL_TABLET | Freq: Four times a day (QID) | ORAL | Status: DC | PRN

## 2019-09-18 MED ORDER — METRONIDAZOLE IN NACL 5-0.79 MG/ML-% IV SOLN
500.0000 mg | Freq: Once | INTRAVENOUS | Status: AC
  Administered 2019-09-18: 500 mg via INTRAVENOUS
  Filled 2019-09-18: qty 100

## 2019-09-18 MED ORDER — IOHEXOL 300 MG/ML  SOLN
100.0000 mL | Freq: Once | INTRAMUSCULAR | Status: AC | PRN
  Administered 2019-09-18: 100 mL via INTRAVENOUS

## 2019-09-18 MED ORDER — SODIUM CHLORIDE 0.9 % IV BOLUS
1000.0000 mL | Freq: Once | INTRAVENOUS | Status: AC
  Administered 2019-09-18: 1000 mL via INTRAVENOUS

## 2019-09-18 MED ORDER — ACETAMINOPHEN 325 MG PO TABS
650.0000 mg | ORAL_TABLET | Freq: Four times a day (QID) | ORAL | Status: DC | PRN
  Administered 2019-09-19 – 2019-09-21 (×4): 650 mg via ORAL
  Filled 2019-09-18 (×4): qty 2

## 2019-09-18 MED ORDER — SODIUM CHLORIDE 0.9 % IV SOLN
1.0000 g | Freq: Once | INTRAVENOUS | Status: AC
  Administered 2019-09-18: 1 g via INTRAVENOUS
  Filled 2019-09-18: qty 10

## 2019-09-18 MED ORDER — SODIUM CHLORIDE 0.9 % IV SOLN: INTRAVENOUS | Status: AC

## 2019-09-18 MED ORDER — ACETAMINOPHEN 650 MG RE SUPP: 650.0000 mg | Freq: Four times a day (QID) | RECTAL | Status: DC | PRN

## 2019-09-18 MED ORDER — FENTANYL CITRATE (PF) 100 MCG/2ML IJ SOLN
12.5000 ug | INTRAMUSCULAR | Status: DC | PRN
  Filled 2019-09-18: qty 2

## 2019-09-18 NOTE — Consult Note (Addendum)
General Surgery [facility]  Reason for Consult: abdominal pain, enteritis, partial SBO  Referring Physician: Cortni, PA, [facility]   Melanie Kaiser is an 69 y.o. female.  HPI:  Patient is a 69 yo BF admitted to the medical service on transfer from [facility] with abdominal pain, nausea, emesis, leukocytosis, and a CT scan demonstrating thickening of the distal small bowel with fluid in the pelvis and possible partial small bowel obstruction.  Patient had an episode similar this in March, 2021, for which she was admitted.  Symptoms resolved without operative intervention or placement of an NG tube.  Patient developed abdominal pain this morning according to her husband at the bedside.  She had two episodes of emesis.  She has been having normal BM's.  She is up to date on her colonoscopy.  Prior abdominal surgery includes hysterectomy.  Past Medical History:  Diagnosis Date  . Allergic rhinitis allergic   allergic rhinitis  . 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 Depression   . Diabetes mellitus   . Hypertension     Past Surgical History:  Procedure Laterality Date  . ABDOMINAL HYSTERECTOMY     vaginal  . BRAIN AVM REPAIR      Family History  Problem Relation Age of Onset  . Mental illness Father   . Stroke Father     Social History:  reports that she has never smoked. She has never used smokeless tobacco. She reports that she does not drink alcohol or use drugs.  Allergies: No Known Allergies  Medications: I have reviewed the patient's current medications.  Results for orders placed or performed during the hospital encounter of 09/18/19 (from the past 48 hour(s))  CBC with Differential     Status: Abnormal   Collection Time: 09/18/19 12:39 PM  Result Value Ref Range   WBC 13.9 (H) 4.0 - 10.5 K/uL   RBC 4.59 3.87 - 5.11 MIL/uL   Hemoglobin 12.7 12.0 - 15.0 g/dL   HCT 60.7 37.1 - 06.2 %   MCV 86.3 80.0 - 100.0 fL   MCH  27.7 26.0 - 34.0 pg   MCHC 32.1 30.0 - 36.0 g/dL   RDW 69.4 85.4 - 62.7 %   Platelets 209 150 - 400 K/uL   nRBC 0.0 0.0 - 0.2 %   Neutrophils Relative % 88 %   Neutro Abs 12.2 (H) 1.7 - 7.7 K/uL   Lymphocytes Relative 9 %   Lymphs Abs 1.2 0.7 - 4.0 K/uL   Monocytes Relative 3 %   Monocytes Absolute 0.4 0.1 - 1.0 K/uL   Eosinophils Relative 0 %   Eosinophils Absolute 0.0 0.0 - 0.5 K/uL   Basophils Relative 0 %   Basophils Absolute 0.0 0.0 - 0.1 K/uL   Immature Granulocytes 0 %   Abs Immature Granulocytes 0.06 0.00 - 0.07 K/uL    Comment: Performed at [facility], [address] [place], [place] [address]  Comprehensive metabolic panel     Status: Abnormal   Collection Time: 09/18/19 12:39 PM  Result Value Ref Range   Sodium 137 135 - 145 mmol/L   Potassium 4.7 3.5 - 5.1 mmol/L    Comment: SLIGHT HEMOLYSIS   Chloride 98 98 - 111 mmol/L   CO2 25 22 - 32 mmol/L   Glucose, Bld 127 (H) 70 - 99 mg/dL    Comment: Glucose reference range applies only to samples taken after fasting for at  least 8 hours.   BUN 16 8 - 23 mg/dL   Creatinine, Ser 7.16 0.44 - 1.00 mg/dL   Calcium 9.7 8.9 - 96.7 mg/dL   Total Protein 8.9 (H) 6.5 - 8.1 g/dL   Albumin 5.1 (H) 3.5 - 5.0 g/dL   AST 67 (H) 15 - 41 U/L   ALT 74 (H) 0 - 44 U/L   Alkaline Phosphatase 78 38 - 126 U/L   Total Bilirubin 1.2 0.3 - 1.2 mg/dL   GFR calc non Af Amer >60 >60 mL/min   GFR calc Af Amer >60 >60 mL/min   Anion gap 14 5 - 15    Comment: Performed at [facility], [address] [place], [place] [address]  Lipase, blood     Status: Abnormal   Collection Time: 09/18/19 12:39 PM  Result Value Ref Range   Lipase 63 (H) 11 - 51 U/L    Comment: Performed at [facility], [address] [place], [place] [address]  Troponin I (High Sensitivity)     Status: None   Collection Time: 09/18/19 12:39 PM  Result Value Ref Range   Troponin I (High Sensitivity) 5 <18 ng/L    Comment: (NOTE) Elevated  high sensitivity troponin I (hsTnI) values and significant  changes across serial measurements may suggest ACS but many other  chronic and acute conditions are known to elevate hsTnI results.  Refer to the "Links" section for chest pain algorithms and additional  guidance. Performed at [facility], [address] [place], [place] [address]   Lactic acid, plasma     Status: None   Collection Time: 09/18/19  4:28 PM  Result Value Ref Range   Lactic Acid, Venous 1.6 0.5 - 1.9 mmol/L    Comment: Performed at [facility], [address] [place], [place] [address]  Urinalysis, Routine w reflex microscopic     Status: Abnormal   Collection Time: 09/18/19  4:54 PM  Result Value Ref Range   Color, Urine YELLOW YELLOW   APPearance CLEAR CLEAR   Specific Gravity, Urine <1.005 (L) 1.005 - 1.030   pH 6.0 5.0 - 8.0   Glucose, UA NEGATIVE NEGATIVE mg/dL   Hgb urine dipstick NEGATIVE NEGATIVE   Bilirubin Urine NEGATIVE NEGATIVE   Ketones, ur NEGATIVE NEGATIVE mg/dL   Protein, ur NEGATIVE NEGATIVE mg/dL   Nitrite NEGATIVE NEGATIVE   Leukocytes,Ua NEGATIVE NEGATIVE    Comment: Microscopic not done on urines with negative protein, blood, leukocytes, nitrite, or glucose < 500 mg/dL. Performed at [facility], [address] [place], [place] [address]   SARS Coronavirus 2 by RT PCR (hospital order, performed in [facility] hospital lab) Nasopharyngeal Urine, Catheterized     Status: None   Collection Time: 09/18/19  4:55 PM   Specimen: Urine, Catheterized; Nasopharyngeal  Result Value Ref Range   SARS Coronavirus 2 NEGATIVE NEGATIVE    Comment: (NOTE) SARS-CoV-2 target nucleic acids are NOT DETECTED. The SARS-CoV-2 RNA is generally detectable in upper and lower respiratory specimens during the acute phase of infection. The lowest concentration of SARS-CoV-2 viral copies this assay can detect is 250 copies / mL. A negative result does not preclude SARS-CoV-2  infection and should not be used as the sole basis for treatment or other patient management decisions.  A negative result may occur with improper specimen collection / handling, submission of specimen other than nasopharyngeal swab, presence of viral  mutation(s) within the areas targeted by this assay, and inadequate number of viral copies (<250 copies / mL). A negative result must be combined with clinical observations, patient history, and epidemiological information. Fact Sheet for Patients:   BoilerBrush.com.cy Fact Sheet for Healthcare Providers: https://pope.com/ This test is not yet approved or cleared  by the [place] FDA and has been authorized for detection and/or diagnosis of SARS-CoV-2 by FDA under an Emergency Use Authorization (EUA).  This EUA will remain in effect (meaning this test can be used) for the duration of the COVID-19 declaration under Section 564(b)(1) of the Act, 21 U.S.C. section 360bbb-3(b)(1), unless the authorization is terminated or revoked sooner. Performed at [facility], [address] [place], [place] [address]   Glucose, capillary     Status: Abnormal   Collection Time: 09/18/19  8:24 PM  Result Value Ref Range   Glucose-Capillary 104 (H) 70 - 99 mg/dL    Comment: Glucose reference range applies only to samples taken after fasting for at least 8 hours.    CT ABDOMEN PELVIS W CONTRAST  Addendum Date: 09/18/2019   ADDENDUM REPORT: 09/18/2019 15:57 ADDENDUM: Such appearance could be caused by low grade, incomplete small bowel obstruction at the level of the distal ileum. Does the patient have known inflammatory bowel disease? Electronically Signed   By: Ted Mcalpine M.D.   On: 09/18/2019 15:57   Result Date: 09/18/2019 CLINICAL DATA:  Abdominal pain, nausea vomiting. EXAM: CT ABDOMEN AND PELVIS WITH CONTRAST TECHNIQUE: Multidetector CT imaging of the abdomen and pelvis was  performed using the standard protocol following bolus administration of intravenous contrast. CONTRAST:  OMNIPAQUE IOHEXOL 300 MG/ML  SOLN COMPARISON:  Aug 31, 2019 FINDINGS: Lower chest: No acute abnormality. Hepatobiliary: No focal liver abnormality is seen. No gallstones, gallbladder wall thickening, or biliary dilatation. Pancreas: Unremarkable. No pancreatic ductal dilatation or surrounding inflammatory changes. Spleen: Normal in size without focal abnormality. Adrenals/Urinary Tract: Right-sided renal cyst, bilateral too small to be actually characterized few mm hypoattenuated renal lesions. No evidence of hydronephrosis or nephrolithiasis. Normal urinary bladder. Stomach/Bowel: Normal appearance of the stomach and proximal small bowel. Fluid-filled moderately dilated distal ileal loops with symmetric mucosal thickening. Associated mesenteric stranding. Moderate to large pelvic ascites. A transitional point is identified in the lower central abdomen, best seen on the coronal view. The colon is decompressed. The maximum diameter of the abnormal small bowel loops is 2.6 cm, which is sub pathologic by CT criteria. Vascular/Lymphatic: No significant vascular findings are present. No enlarged abdominal or pelvic lymph nodes. Reproductive: Status post hysterectomy. No adnexal masses. Other: No abdominal wall hernia or abnormality. No abdominopelvic ascites. Musculoskeletal: No acute or significant osseous findings. IMPRESSION: 1. Fluid-filled mildly dilated distal ileal loops with symmetric mucosal thickening, and associated mesenteric stranding and moderate to large pelvic ascites. A transitional point is identified in the lower central abdomen, best seen on the coronal view. The colon is decompressed. These findings may represent infectious or inflammatory enteritis. Electronically Signed: By: Ted Mcalpine M.D. On: 09/18/2019 14:25    Review of Systems  Constitutional: Negative.   HENT: Negative.    Eyes: Negative.   Respiratory: Negative.   Cardiovascular: Negative.   Gastrointestinal: Positive for abdominal pain, nausea and vomiting.  Endocrine: Negative.   Genitourinary: Negative.   Musculoskeletal: Negative.   Skin: Negative.   Allergic/Immunologic: Negative.   Neurological: Negative.   Hematological: Negative.   Psychiatric/Behavioral: Negative.     Blood pressure (!) 121/55, pulse  72, temperature 98.6 F (37 C), resp. rate 16, height 5\' 3"  (1.6 m), weight 84.8 kg, SpO2 97 %.  CONSTITUTIONAL: NAD; conversant; no obvious deformities EYES:   Moist conjunctiva; no lid lag; anicteric; PERRL NECK:   Trachea midline; no thyromegaly LUNGS:  Normal respiratory effort; no wheeze; no rales; no tactile fremitus CV:   RRR; no palpable thrills; no pitting edema GI:    Abdomen soft without distension; BS present; no mass; no tenderness; no obvious hernia; no palpable hepatosplenomegaly MSK:    Normal range of motion of extremities; no clubbing; no cyanosis PSYCHIATRIC:  Appropriate affect for situation; responsive slowly to questions LYMPHATIC:   No palpable cervical lymphadenopathy; no palpable axillary adenopathy  Assessment/Plan: Abdominal pain, probable enteritis with partial SBO  NPO, IV hydration  Agree with holding off on NG unless emesis persists  Repeat AXR in AM 5/24 - ordered  Appreciate medical service admission and management.  Will follow with you.  If NG placed, will initiate SB protocol.  May need GI referral for signs of enteritis versus inflammatory bowel disease.  No role for immediate operative intervention at this time.  Armandina Gemma, MD [facility] Office: [place] 09/18/2019, 11:05 PM

## 2019-09-18 NOTE — H&P (Signed)
History and Physical    MIKI LABUDA YSA:[id number] DOB: 04/13/1951 DOA: 09/18/2019  PCP: Cari Caraway, MD   Patient coming from: Home   Chief Complaint: Abdominal pain, N/V   HPI: Melanie Kaiser is a 69 y.o. female with medical history significant for dementia, hypertension, prediabetes, and admission in March with possible low-grade SBO, now presenting to the emergency department with 1 day of abdominal pain, nausea, and vomiting.  Patient had had a small bowel movement earlier and went on to complain of abdominal pain throughout the day with some nausea and nonbloody vomiting.  She had been in her usual state when going to bed last night. Her husband provides most of the history and notes that this is the first time he has seen her vomit after 22 years of marriage. There has not been any diarrhea, melena, or hematochezia.    [facility] ED Course: Upon arrival to the ED, patient is found to be afebrile, saturating mid 90s on room air, and with stable blood pressure.  EKG features sinus rhythm.  Chemistry panel with mild elevation in transaminases, elevated albumin and total protein, and mild elevation in lipase.  CBC with leukocytosis to 13,900.  Lactic acid is normal.  Urinalysis with low specific gravity.  Blood and urine cultures were collected, 1 L of saline was administered, and the patient was treated with morphine, Zofran, Rocephin, and Flagyl in the ED.  Surgery was consulted by the ED physician and recommended medical admission.  Review of Systems:  All other systems reviewed and apart from HPI, are negative.  Past Medical History:  Diagnosis Date  . Allergic rhinitis allergic   allergic rhinitis  . 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 Depression   . Diabetes mellitus   . Hypertension     Past Surgical History:  Procedure Laterality Date  . ABDOMINAL HYSTERECTOMY     vaginal  . BRAIN AVM REPAIR       reports that she has never smoked. She  has never used smokeless tobacco. She reports that she does not drink alcohol or use drugs.  No Known Allergies  Family History  Problem Relation Age of Onset  . Mental illness Father   . Stroke Father      Prior to Admission medications   Medication Sig Start Date End Date Taking? Authorizing Provider  B Complex Vitamins (VITAMIN B COMPLEX) TABS Take 1 tablet by mouth daily.   Yes [provider]  Cholecalciferol (VITAMIN D-3) 25 MCG (1000 UT) CAPS Take 1,000 Units by mouth daily.   Yes [provider]  ferrous sulfate 325 (65 FE) MG EC tablet Take 325 mg by mouth daily with breakfast.   Yes [provider]  LORazepam (ATIVAN) 0.5 MG tablet Take 0.25 mg by mouth daily.  09/12/19  Yes [provider]  OVER THE COUNTER MEDICATION Take 1 tablet by mouth daily. Mega Red   Yes [provider]  propranolol (INDERAL) 40 MG tablet Take 40 mg by mouth 2 (two) times daily. 07/21/19  Yes [provider]  QUEtiapine (SEROQUEL) 50 MG tablet Take 50-100 mg by mouth See admin instructions. Takes 50 mg by mouth in the morning and 100 mg at night. 06/20/19  Yes [provider]  triamterene-hydrochlorothiazide (MAXZIDE-25) 37.5-25 MG per tablet [place] Patient taking differently: Take 1 tablet by mouth daily.  01/02/11  Yes Baxley, Cresenciano Lick, MD  vitamin E 1000 UNIT capsule  Take 1,000 Units by mouth daily.   Yes [provider]  metoprolol succinate (TOPROL-XL) 25 MG 24 hr tablet Take 25 mg by mouth daily. 09/17/17   [provider]    Physical Exam: Vitals:   09/18/19 1702 09/18/19 1800 09/18/19 1807 09/18/19 1903  BP:  (!) 172/116 (!) 172/116 (!) 121/55  Pulse:  79 73 72  Resp:  (!) 25 (!) 30 16  Temp: 99.2 F (37.3 C)   98.6 F (37 C)  TempSrc: Rectal     SpO2:  94% 95% 97%  Weight:      Height:        Constitutional: NAD, calm  Eyes: PERTLA, lids and conjunctivae normal ENMT: Mucous  membranes are moist. Posterior pharynx clear of any exudate or lesions.   Neck: normal, supple, no masses, no thyromegaly Respiratory: no wheezing, no crackles. No accessory muscle use.  Cardiovascular: S1 & S2 heard, regular rate and rhythm. No extremity edema.   Abdomen: Soft, non-distended, mild tenderness in mid- and lower abdomen without rebound pain or guarding. High-pitched bowel sounds.  Musculoskeletal: no clubbing / cyanosis. No joint deformity upper and lower extremities.   Skin: no significant rashes, lesions, ulcers. Warm, dry, well-perfused. Neurologic: No facial asymmetry. Sensation intact. Moving all extremities.  Psychiatric: Alert and oriented to person only. Pleasant and cooperative.    Labs and Imaging on Admission: I have personally reviewed following labs and imaging studies  CBC: Recent Labs  Lab 09/18/19 1239  WBC 13.9*  NEUTROABS 12.2*  HGB 12.7  HCT 39.6  MCV 86.3  PLT 209   Basic Metabolic Panel: Recent Labs  Lab 09/18/19 1239  NA 137  K 4.7  CL 98  CO2 25  GLUCOSE 127*  BUN 16  CREATININE 0.86  CALCIUM 9.7   GFR: Estimated Creatinine Clearance: 62.8 mL/min (by C-G formula based on SCr of 0.86 mg/dL). Liver Function Tests: Recent Labs  Lab 09/18/19 1239  AST 67*  ALT 74*  ALKPHOS 78  BILITOT 1.2  PROT 8.9*  ALBUMIN 5.1*   Recent Labs  Lab 09/18/19 1239  LIPASE 63*   No results for input(s): AMMONIA in the last 168 hours. Coagulation Profile: No results for input(s): INR, PROTIME in the last 168 hours. Cardiac Enzymes: No results for input(s): CKTOTAL, CKMB, CKMBINDEX, TROPONINI in the last 168 hours. BNP (last 3 results) No results for input(s): PROBNP in the last 8760 hours. HbA1C: No results for input(s): HGBA1C in the last 72 hours. CBG: No results for input(s): GLUCAP in the last 168 hours. Lipid Profile: No results for input(s): CHOL, HDL, LDLCALC, TRIG, CHOLHDL, LDLDIRECT in the last 72 hours. Thyroid Function  Tests: No results for input(s): TSH, T4TOTAL, FREET4, T3FREE, THYROIDAB in the last 72 hours. Anemia Panel: No results for input(s): VITAMINB12, FOLATE, FERRITIN, TIBC, IRON, RETICCTPCT in the last 72 hours. Urine analysis:    Component Value Date/Time   COLORURINE YELLOW 09/18/2019 1654   APPEARANCEUR CLEAR 09/18/2019 1654   LABSPEC <1.005 (L) 09/18/2019 1654   PHURINE 6.0 09/18/2019 1654   GLUCOSEU NEGATIVE 09/18/2019 1654   HGBUR NEGATIVE 09/18/2019 1654   BILIRUBINUR NEGATIVE 09/18/2019 1654   KETONESUR NEGATIVE 09/18/2019 1654   PROTEINUR NEGATIVE 09/18/2019 1654   NITRITE NEGATIVE 09/18/2019 1654   LEUKOCYTESUR NEGATIVE 09/18/2019 1654   Sepsis Labs: @LABRCNTIP (procalcitonin:4,lacticidven:4) ) Recent Results (from the past 240 hour(s))  SARS Coronavirus 2 by RT PCR (hospital order, performed in [facility] hospital lab) Nasopharyngeal Urine, Catheterized  Status: None   Collection Time: 09/18/19  4:55 PM   Specimen: Urine, Catheterized; Nasopharyngeal  Result Value Ref Range Status   SARS Coronavirus 2 NEGATIVE NEGATIVE Final    Comment: (NOTE) SARS-CoV-2 target nucleic acids are NOT DETECTED. The SARS-CoV-2 RNA is generally detectable in upper and lower respiratory specimens during the acute phase of infection. The lowest concentration of SARS-CoV-2 viral copies this assay can detect is 250 copies / mL. A negative result does not preclude SARS-CoV-2 infection and should not be used as the sole basis for treatment or other patient management decisions.  A negative result may occur with improper specimen collection / handling, submission of specimen other than nasopharyngeal swab, presence of viral mutation(s) within the areas targeted by this assay, and inadequate number of viral copies (<250 copies / mL). A negative result must be combined with clinical observations, patient history, and epidemiological information. Fact Sheet for Patients:    BoilerBrush.com.cy Fact Sheet for Healthcare Providers: https://pope.com/ This test is not yet approved or cleared  by the [place] FDA and has been authorized for detection and/or diagnosis of SARS-CoV-2 by FDA under an Emergency Use Authorization (EUA).  This EUA will remain in effect (meaning this test can be used) for the duration of the COVID-19 declaration under Section 564(b)(1) of the Act, 21 U.S.C. section 360bbb-3(b)(1), unless the authorization is terminated or revoked sooner. Performed at [facility], [address] [place], [place] [address]      Radiological Exams on Admission: CT ABDOMEN PELVIS W CONTRAST  Addendum Date: 09/18/2019   ADDENDUM REPORT: 09/18/2019 15:57 ADDENDUM: Such appearance could be caused by low grade, incomplete small bowel obstruction at the level of the distal ileum. Does the patient have known inflammatory bowel disease? Electronically Signed   By: Ted Mcalpine M.D.   On: 09/18/2019 15:57   Result Date: 09/18/2019 CLINICAL DATA:  Abdominal pain, nausea vomiting. EXAM: CT ABDOMEN AND PELVIS WITH CONTRAST TECHNIQUE: Multidetector CT imaging of the abdomen and pelvis was performed using the standard protocol following bolus administration of intravenous contrast. CONTRAST:  OMNIPAQUE IOHEXOL 300 MG/ML  SOLN COMPARISON:  Aug 31, 2019 FINDINGS: Lower chest: No acute abnormality. Hepatobiliary: No focal liver abnormality is seen. No gallstones, gallbladder wall thickening, or biliary dilatation. Pancreas: Unremarkable. No pancreatic ductal dilatation or surrounding inflammatory changes. Spleen: Normal in size without focal abnormality. Adrenals/Urinary Tract: Right-sided renal cyst, bilateral too small to be actually characterized few mm hypoattenuated renal lesions. No evidence of hydronephrosis or nephrolithiasis. Normal urinary bladder. Stomach/Bowel: Normal appearance of the stomach  and proximal small bowel. Fluid-filled moderately dilated distal ileal loops with symmetric mucosal thickening. Associated mesenteric stranding. Moderate to large pelvic ascites. A transitional point is identified in the lower central abdomen, best seen on the coronal view. The colon is decompressed. The maximum diameter of the abnormal small bowel loops is 2.6 cm, which is sub pathologic by CT criteria. Vascular/Lymphatic: No significant vascular findings are present. No enlarged abdominal or pelvic lymph nodes. Reproductive: Status post hysterectomy. No adnexal masses. Other: No abdominal wall hernia or abnormality. No abdominopelvic ascites. Musculoskeletal: No acute or significant osseous findings. IMPRESSION: 1. Fluid-filled mildly dilated distal ileal loops with symmetric mucosal thickening, and associated mesenteric stranding and moderate to large pelvic ascites. A transitional point is identified in the lower central abdomen, best seen on the coronal view. The colon is decompressed. These findings may represent infectious or inflammatory enteritis. Electronically Signed: By: Ted Mcalpine M.D. On:  09/18/2019 14:25    EKG: Independently reviewed. Sinus rhythm.   Assessment/Plan   1. SBO  - Presents with one day of abdominal pain and N/V, had a small BM earlier, no diarrhea or bleeding  - CT findings suggest possible low-grade incomplete SBO  - Surgery consulting and much appreciated  - Continue bowel-rest, pain-control, IVF hydration, NGT decompression if vomiting returns    2. Hypertension  - BP at goal  - Monitor and treat as-needed   3. Pre-diabetes  - A1c was 5.8% in March 2021  - Monitor, use low-intensity SSI if needed    4. Dementia  - Continue supportive care, delirium precautions     5. Transaminase elevation  - Mild elevation in transaminases noted - No acute hepatobiliary finding on CT   - Likely related to the current illness, will monitor     DVT prophylaxis:  SCDs Code Status: Full  Family Communication: Husband updated at bedside   Disposition Plan:  Patient is from: Home  Anticipated d/c is to: Home  Anticipated d/c date is: 09/20/19 Patient currently: Pending surgical evaluation, improvement in abd pain and N/V, ability to tolerate PO  Consults called: Surgery consulted by the ED physician  Admission status: Inpatient     Briscoe Deutscher, MD [facility] Pager: See [email or web address]  If 7AM-7PM, please contact the daytime attending [email or web address]  09/18/2019, 7:51 PM

## 2019-09-18 NOTE — ED Notes (Signed)
Attempted IV to right wrist, unsuccessful, tol well

## 2019-09-18 NOTE — ED Triage Notes (Signed)
Pts family states pt c/o abdominal pain that started today. Also reports nausea and vomiting Denies diarrhea, denies fever. Pt has hx of dementia, husband with patient

## 2019-09-18 NOTE — ED Provider Notes (Signed)
[facility] EMERGENCY DEPARTMENT Provider Note   CSN: [id number] Arrival date & time: 09/18/19  1139     History Chief Complaint  Patient presents with   Abdominal Pain    Melanie Kaiser is a 69 y.o. female.  HPI   69 year old female with a history of allergic rhinitis, AVM of the brainstem, depression, diabetes, hypertension, dementia, who presents to the emergency department today with her family for evaluation of abdominal pain.  Patient's husband and caretaker are at bedside and state that the patient has been complaining of abdominal pain all morning.  She had a small bowel movement and later started having multiple episodes of nausea and vomiting.  She has not had any fevers.  Prior to bed last night she was in her normal state of health.  They deny any other complaints at this time.  Patient with history of dementia therefore there is a level 5 caveat.  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 ([place]) 04/03/2017   Gout 04/03/2017   Allergic rhinitis due to other allergen 04/23/2006   Benign essential hypertension 04/23/2006   Depressive disorder, not elsewhere classified 04/23/2006   Malformation 04/23/2006   Paresthesia 04/23/2006    Past Surgical History:  Procedure Laterality Date   ABDOMINAL HYSTERECTOMY     vaginal   BRAIN AVM REPAIR       OB History    Gravida  3   Para  3   Term  3   Preterm      AB      Living  3     SAB      TAB      Ectopic      Multiple      Live Births  3           Family History  Problem Relation Age of Onset   Mental illness Father    Stroke Father     Social History   Tobacco Use   Smoking status: Never Smoker   Smokeless tobacco: Never Used  Substance Use Topics   Alcohol use: No   Drug use: Never    Home Medications Prior to Admission medications   Medication Sig Start Date End Date Taking? Authorizing Provider  B Complex Vitamins (VITAMIN B COMPLEX) TABS Take 1 tablet by mouth daily.   Yes [provider]  Cholecalciferol (VITAMIN D-3) 25 MCG (1000 UT) CAPS Take 1,000 Units by mouth daily.   Yes [provider]  Coenzyme Q10 (COQ10) 100 MG CAPS Take 100 mg by mouth daily.   Yes [provider]  ferrous sulfate 325 (65 FE) MG EC tablet Take 325 mg by mouth daily with breakfast.   Yes [provider]  Multiple Vitamins-Minerals (MULTIVITAMIN ADULT) TABS Take 1 tablet by mouth daily.   Yes [provider]  OVER THE COUNTER MEDICATION Take 1 tablet by mouth daily. Mega Red   Yes [provider]  QUEtiapine (  SEROQUEL) 50 MG tablet Take 25 mg by mouth 2 (two) times daily. 25mg  in am 50mg  hs 06/20/19  Yes [provider]  vitamin C (ASCORBIC ACID) 500 MG tablet Take 500 mg by mouth daily.   Yes [provider]  vitamin E 1000 UNIT capsule Take 1,000 Units by mouth daily.   Yes [provider]  CINNAMON PO Take by mouth.    [provider]  ELDERBERRY PO Take by mouth.    [provider]  LORazepam (ATIVAN) 0.5 MG tablet SMARTSIG:0.5 Tablet(s) By Mouth Every Evening 09/12/19   [provider]  metoprolol succinate (TOPROL-XL) 25 MG 24 hr tablet Take 25 mg by mouth daily. 09/17/17   [provider]  propranolol (INDERAL) 40 MG tablet Take 40 mg by mouth 2 (two) times daily. 07/21/19   [provider]    triamterene-hydrochlorothiazide (MAXZIDE-25) 37.5-25 MG per tablet TAKE ONE TABLET BY MOUTH EVERY DAY Patient taking differently: Take 1 tablet by mouth daily.  01/02/11   02-02-1992, MD    Allergies    Patient has no known allergies.  Review of Systems   Review of Systems  Unable to perform ROS: Dementia  Constitutional: Negative for fever.  Gastrointestinal: Positive for abdominal pain, nausea and vomiting. Negative for constipation.  All other systems reviewed and are negative.   Physical Exam Updated Vital Signs BP (!) 157/80    Pulse 72    Temp 98.8 F (37.1 C) (Oral)    Resp (!) 21    Ht 5\' 3"  (1.6 m)    Wt 84.8 kg    SpO2 97%    BMI 33.13 kg/m   Physical Exam Vitals and nursing note reviewed.  Constitutional:      General: She is not in acute distress.    Appearance: She is well-developed.     Comments: Appears uncomfortable  HENT:     Head: Normocephalic and atraumatic.  Eyes:     Conjunctiva/sclera: Conjunctivae normal.  Cardiovascular:     Rate and Rhythm: Normal rate and regular rhythm.     Pulses: Normal pulses.     Heart sounds: Normal heart sounds. No murmur.  Pulmonary:     Effort: Pulmonary effort is normal. No respiratory distress.     Breath sounds: Normal breath sounds. No wheezing, rhonchi or rales.  Abdominal:     General: Bowel sounds are decreased.     Palpations: Abdomen is soft.     Tenderness: There is abdominal tenderness in the right lower quadrant, epigastric area, periumbilical area and left lower quadrant. There is guarding.  Musculoskeletal:     Cervical back: Neck supple.  Skin:    General: Skin is warm and dry.  Neurological:     Mental Status: She is alert.     ED Results / Procedures / Treatments   Labs (all labs ordered are listed, but only abnormal results are displayed) Labs Reviewed  CBC WITH DIFFERENTIAL/PLATELET - Abnormal; Notable for the following components:      Result Value   WBC 13.9 (*)    Neutro Abs 12.2  (*)    All other components within normal limits  COMPREHENSIVE METABOLIC PANEL - Abnormal; Notable for the following components:   Glucose, Bld 127 (*)    Total Protein 8.9 (*)    Albumin 5.1 (*)    AST 67 (*)    ALT 74 (*)    All other components within normal limits  LIPASE, BLOOD - Abnormal;  Notable for the following components:   Lipase 63 (*)    All other components within normal limits  CULTURE, BLOOD (ROUTINE X 2)  CULTURE, BLOOD (ROUTINE X 2)  URINE CULTURE  SARS CORONAVIRUS 2 BY RT PCR (HOSPITAL ORDER, PERFORMED IN [facility] LAB)  URINALYSIS, ROUTINE W REFLEX MICROSCOPIC  LACTIC ACID, PLASMA  TROPONIN I (HIGH SENSITIVITY)    EKG EKG Interpretation  Date/Time:  Sunday Sep 18 2019 12:27:35 EDT Ventricular Rate:  72 PR Interval:    QRS Duration: 87 QT Interval:  405 QTC Calculation: 444 R Axis:   33 Text Interpretation: Sinus rhythm Confirmed by Virgina Norfolk [phone]) on 09/18/2019 3:21:02 PM   Radiology CT ABDOMEN PELVIS W CONTRAST  Addendum Date: 09/18/2019   ADDENDUM REPORT: 09/18/2019 15:57 ADDENDUM: Such appearance could be caused by low grade, incomplete small bowel obstruction at the level of the distal ileum. Does the patient have known inflammatory bowel disease? Electronically Signed   By: Ted Mcalpine M.D.   On: 09/18/2019 15:57   Result Date: 09/18/2019 CLINICAL DATA:  Abdominal pain, nausea vomiting. EXAM: CT ABDOMEN AND PELVIS WITH CONTRAST TECHNIQUE: Multidetector CT imaging of the abdomen and pelvis was performed using the standard protocol following bolus administration of intravenous contrast. CONTRAST:  OMNIPAQUE IOHEXOL 300 MG/ML  SOLN COMPARISON:  Aug 31, 2019 FINDINGS: Lower chest: No acute abnormality. Hepatobiliary: No focal liver abnormality is seen. No gallstones, gallbladder wall thickening, or biliary dilatation. Pancreas: Unremarkable. No pancreatic ductal dilatation or surrounding inflammatory changes. Spleen: Normal in  size without focal abnormality. Adrenals/Urinary Tract: Right-sided renal cyst, bilateral too small to be actually characterized few mm hypoattenuated renal lesions. No evidence of hydronephrosis or nephrolithiasis. Normal urinary bladder. Stomach/Bowel: Normal appearance of the stomach and proximal small bowel. Fluid-filled moderately dilated distal ileal loops with symmetric mucosal thickening. Associated mesenteric stranding. Moderate to large pelvic ascites. A transitional point is identified in the lower central abdomen, best seen on the coronal view. The colon is decompressed. The maximum diameter of the abnormal small bowel loops is 2.6 cm, which is sub pathologic by CT criteria. Vascular/Lymphatic: No significant vascular findings are present. No enlarged abdominal or pelvic lymph nodes. Reproductive: Status post hysterectomy. No adnexal masses. Other: No abdominal wall hernia or abnormality. No abdominopelvic ascites. Musculoskeletal: No acute or significant osseous findings. IMPRESSION: 1. Fluid-filled mildly dilated distal ileal loops with symmetric mucosal thickening, and associated mesenteric stranding and moderate to large pelvic ascites. A transitional point is identified in the lower central abdomen, best seen on the coronal view. The colon is decompressed. These findings may represent infectious or inflammatory enteritis. Electronically Signed: By: Ted Mcalpine M.D. On: 09/18/2019 14:25    Procedures Procedures (including critical care time)  Medications Ordered in ED Medications  0.9 %  sodium chloride infusion (has no administration in time range)  cefTRIAXone (ROCEPHIN) 1 g in sodium chloride 0.9 % 100 mL IVPB (has no administration in time range)  metroNIDAZOLE (FLAGYL) IVPB 500 mg (has no administration in time range)  ondansetron (ZOFRAN) injection 4 mg (4 mg Intravenous Given 09/18/19 1242)  sodium chloride 0.9 % bolus 1,000 mL (0 mLs Intravenous Stopped 09/18/19 1430)    morphine 4 MG/ML injection 4 mg (4 mg Intravenous Given 09/18/19 1243)  iohexol (OMNIPAQUE) 300 MG/ML solution 100 mL (100 mLs Intravenous Contrast Given 09/18/19 1326)    ED Course  I have reviewed the triage vital signs and the nursing notes.  Pertinent labs & imaging results that were  available during my care of the patient were reviewed by me and considered in my medical decision making (see chart for details).    MDM Rules/Calculators/A&P                      69 year old female presenting for evaluation of abdominal pain starting this morning.  Associated with nausea and vomiting.  Has history of SBO.  We will get labs, CT abdomen/pelvis.  Will give IV fluids, pain medicines, antiemetics.  Reviewed/interpreted labs and imaging CBC with leukocytosis at 13,000, no anemia CMP with normal electrolytes and kidney function.  Liver enzymes are somewhat elevated but bilirubin is normal Lipase lipase is marginally elevated at 63  CT abdomen/pelvis -  With fluid-filled mildly dilated distal ileal loops with symmetric mucosal thickening, and associated mesenteric stranding and moderate to large pelvic ascites. A transitional point is identified in the lower central abdomen, best seen on the coronal view. The colon is decompressed. These findings may represent infectious or inflammatory enteritis  3:21 PM CONSULT with Dr. Gerrit Friends with general surgery who will consult on the patient.  Recommends medical admit.   4:01 PM PM CONSULT With Dr. Isidoro Donning who accepts patient for admission at [facility]. She recommends giving ceftriaxone and flagyl to cover possible bacterial gastroenteritis.   Final Clinical Impression(s) / ED Diagnoses Final diagnoses:  Enteritis    Rx / DC Orders ED Discharge Orders    None       Rayne Du 09/18/19 1643    Milagros Loll, MD 09/21/19 1005

## 2019-09-19 ENCOUNTER — Inpatient Hospital Stay (HOSPITAL_COMMUNITY): Payer: Medicare PPO

## 2019-09-19 LAB — CBC WITH DIFFERENTIAL/PLATELET
Abs Immature Granulocytes: 0.03 10*3/uL (ref 0.00–0.07)
Basophils Absolute: 0 10*3/uL (ref 0.0–0.1)
Basophils Relative: 0 %
Eosinophils Absolute: 0.1 10*3/uL (ref 0.0–0.5)
Eosinophils Relative: 1 %
HCT: 33 % — ABNORMAL LOW (ref 36.0–46.0)
Hemoglobin: 10.4 g/dL — ABNORMAL LOW (ref 12.0–15.0)
Immature Granulocytes: 0 %
Lymphocytes Relative: 21 %
Lymphs Abs: 2.1 10*3/uL (ref 0.7–4.0)
MCH: 27.8 pg (ref 26.0–34.0)
MCHC: 31.5 g/dL (ref 30.0–36.0)
MCV: 88.2 fL (ref 80.0–100.0)
Monocytes Absolute: 0.6 10*3/uL (ref 0.1–1.0)
Monocytes Relative: 6 %
Neutro Abs: 7.2 10*3/uL (ref 1.7–7.7)
Neutrophils Relative %: 72 %
Platelets: 185 10*3/uL (ref 150–400)
RBC: 3.74 MIL/uL — ABNORMAL LOW (ref 3.87–5.11)
RDW: 15.3 % (ref 11.5–15.5)
WBC: 10.1 10*3/uL (ref 4.0–10.5)
nRBC: 0 % (ref 0.0–0.2)

## 2019-09-19 LAB — MAGNESIUM: Magnesium: 1.8 mg/dL (ref 1.7–2.4)

## 2019-09-19 LAB — COMPREHENSIVE METABOLIC PANEL
ALT: 53 U/L — ABNORMAL HIGH (ref 0–44)
AST: 33 U/L (ref 15–41)
Albumin: 3.9 g/dL (ref 3.5–5.0)
Alkaline Phosphatase: 59 U/L (ref 38–126)
Anion gap: 7 (ref 5–15)
BUN: 14 mg/dL (ref 8–23)
CO2: 27 mmol/L (ref 22–32)
Calcium: 8.8 mg/dL — ABNORMAL LOW (ref 8.9–10.3)
Chloride: 104 mmol/L (ref 98–111)
Creatinine, Ser: 0.77 mg/dL (ref 0.44–1.00)
GFR calc Af Amer: >60 mL/min (ref >60)
GFR calc non Af Amer: >60 mL/min (ref >60)
Glucose, Bld: 121 mg/dL — ABNORMAL HIGH (ref 70–99)
Potassium: 3.3 mmol/L — ABNORMAL LOW (ref 3.5–5.1)
Sodium: 138 mmol/L (ref 135–145)
Total Bilirubin: 0.7 mg/dL (ref 0.3–1.2)
Total Protein: 6.7 g/dL (ref 6.5–8.1)

## 2019-09-19 LAB — HEMOGLOBIN A1C
Hgb A1c MFr Bld: 6.2 % — ABNORMAL HIGH (ref 4.8–5.6)
Mean Plasma Glucose: 131.24 mg/dL

## 2019-09-19 LAB — GLUCOSE, CAPILLARY
Glucose-Capillary: 117 mg/dL — ABNORMAL HIGH (ref 70–99)
Glucose-Capillary: 126 mg/dL — ABNORMAL HIGH (ref 70–99)
Glucose-Capillary: 94 mg/dL (ref 70–99)
Glucose-Capillary: 129 mg/dL — ABNORMAL HIGH (ref 70–99)

## 2019-09-19 LAB — LIPASE, BLOOD: Lipase: 21 U/L (ref 11–51)

## 2019-09-19 LAB — PHOSPHORUS: Phosphorus: 3.2 mg/dL (ref 2.5–4.6)

## 2019-09-19 MED ORDER — QUETIAPINE FUMARATE 50 MG PO TABS
50.0000 mg | ORAL_TABLET | Freq: Every day | ORAL | Status: DC
  Administered 2019-09-19 – 2019-09-21 (×3): 50 mg via ORAL
  Filled 2019-09-19 (×3): qty 1

## 2019-09-19 MED ORDER — QUETIAPINE FUMARATE 50 MG PO TABS: 50.0000 mg | ORAL_TABLET | ORAL | Status: DC

## 2019-09-19 MED ORDER — KETOROLAC TROMETHAMINE 15 MG/ML IJ SOLN
15.0000 mg | Freq: Once | INTRAMUSCULAR | Status: AC
  Administered 2019-09-19: 15 mg via INTRAVENOUS
  Filled 2019-09-19: qty 1

## 2019-09-19 MED ORDER — INSULIN ASPART 100 UNIT/ML ~~LOC~~ SOLN: 0.0000 [IU] | Freq: Three times a day (TID) | SUBCUTANEOUS | Status: DC

## 2019-09-19 MED ORDER — ENOXAPARIN SODIUM 40 MG/0.4ML ~~LOC~~ SOLN
40.0000 mg | SUBCUTANEOUS | Status: DC
  Administered 2019-09-19 – 2019-09-21 (×3): 40 mg via SUBCUTANEOUS
  Filled 2019-09-19 (×3): qty 0.4

## 2019-09-19 MED ORDER — POTASSIUM CHLORIDE 10 MEQ/100ML IV SOLN
10.0000 meq | INTRAVENOUS | Status: AC
  Administered 2019-09-19 (×3): 10 meq via INTRAVENOUS
  Filled 2019-09-19: qty 100

## 2019-09-19 MED ORDER — QUETIAPINE FUMARATE 50 MG PO TABS
100.0000 mg | ORAL_TABLET | Freq: Every day | ORAL | Status: DC
  Administered 2019-09-19 – 2019-09-20 (×2): 100 mg via ORAL
  Filled 2019-09-19 (×2): qty 2

## 2019-09-19 MED ORDER — KCL-LACTATED RINGERS-D5W 20 MEQ/L IV SOLN
INTRAVENOUS | Status: DC
  Filled 2019-09-19 (×4): qty 1000

## 2019-09-19 MED ORDER — LORAZEPAM 0.5 MG PO TABS
0.2500 mg | ORAL_TABLET | Freq: Every day | ORAL | Status: DC
  Administered 2019-09-19 – 2019-09-20 (×2): 0.25 mg via ORAL
  Filled 2019-09-19 (×2): qty 1

## 2019-09-19 MED ORDER — KETOROLAC TROMETHAMINE 15 MG/ML IJ SOLN
15.0000 mg | Freq: Three times a day (TID) | INTRAMUSCULAR | Status: DC | PRN
  Administered 2019-09-19: 15 mg via INTRAVENOUS
  Filled 2019-09-19: qty 1

## 2019-09-19 MED ORDER — MAGNESIUM SULFATE 2 GM/50ML IV SOLN
2.0000 g | Freq: Once | INTRAVENOUS | Status: AC
  Administered 2019-09-19: 2 g via INTRAVENOUS
  Filled 2019-09-19: qty 50

## 2019-09-19 NOTE — Plan of Care (Signed)

## 2019-09-19 NOTE — Progress Notes (Addendum)
[facility] [place] [facility] Progress Note     Subjective:  Patient appears comfortable in bed. Husband at bedside and assisted in history as patient has dementia. She has been to the bathroom several times with the NT but he is not aware if she had any BM, none recorded. Patient denies abdominal pain or nausea this AM.   Objective: Vital signs in last 24 hours: Temp:  [98.6 F (37 C)-99.2 F (37.3 C)] 98.6 F (37 C) (05/24 0420) Pulse Rate:  [67-79] 67 (05/24 0420) Resp:  [16-30] 18 (05/24 0420) BP: (121-172)/(55-116) 137/71 (05/24 0420) SpO2:  [94 %-100 %] 96 % (05/24 0420) Weight:  [84.8 kg] 84.8 kg (05/23 1153) Last BM Date: 09/18/19  Intake/Output from previous day: 05/23 0701 - 05/24 0700 In: 1221.3 [I.V.:221.3; IV Piggyback:1000] Out: 0  Intake/Output this shift: Total I/O In: -  Out: 100 [Urine:100]  PE: General: pleasant, WD, WN female who is laying in bed in NAD HEENT:  Sclera are noninjected.  PERRL.  Ears and nose without any masses or lesions.  Mouth is pink and moist Heart: regular, rate, and rhythm.  Normal s1,s2. No obvious murmurs, gallops, or rubs noted.  Palpable radial and pedal pulses bilaterally Lungs: CTAB, no wheezes, rhonchi, or rales noted.  Respiratory effort nonlabored Abd: soft, NT, ND, +BS, no masses, hernias, or organomegaly    Lab Results:  Recent Labs    09/18/19 1239 09/19/19 0321  WBC 13.9* 10.1  HGB 12.7 10.4*  HCT 39.6 33.0*  PLT 209 185   BMET Recent Labs    09/18/19 1239 09/19/19 0321  NA 137 138  K 4.7 3.3*  CL 98 104  CO2 25 27  GLUCOSE 127* 121*  BUN 16 14  CREATININE 0.86 0.77  CALCIUM 9.7 8.8*   PT/INR No results for input(s): LABPROT, INR in the last 72 hours. CMP     Component Value Date/Time   NA 138 09/19/2019 0321   K 3.3 (L) 09/19/2019 0321   CL 104 09/19/2019 0321   CO2 27 09/19/2019 0321   GLUCOSE 121 (H) 09/19/2019 0321   BUN 14 09/19/2019 0321   CREATININE 0.77 09/19/2019 0321   CALCIUM 8.8 (L)  09/19/2019 0321   PROT 6.7 09/19/2019 0321   ALBUMIN 3.9 09/19/2019 0321   AST 33 09/19/2019 0321   ALT 53 (H) 09/19/2019 0321   ALKPHOS 59 09/19/2019 0321   BILITOT 0.7 09/19/2019 0321   GFRNONAA >60 09/19/2019 0321   GFRAA >60 09/19/2019 0321   Lipase     Component Value Date/Time   LIPASE 21 09/19/2019 0321       Studies/Results: CT ABDOMEN PELVIS W CONTRAST  Addendum Date: 09/18/2019   ADDENDUM REPORT: 09/18/2019 15:57 ADDENDUM: Such appearance could be caused by low grade, incomplete small bowel obstruction at the level of the distal ileum. Does the patient have known inflammatory bowel disease? Electronically Signed   By: Fidela Salisbury M.D.   On: 09/18/2019 15:57   Result Date: 09/18/2019 CLINICAL DATA:  Abdominal pain, nausea vomiting. EXAM: CT ABDOMEN AND PELVIS WITH CONTRAST TECHNIQUE: Multidetector CT imaging of the abdomen and pelvis was performed using the standard protocol following bolus administration of intravenous contrast. CONTRAST:  163mL OMNIPAQUE IOHEXOL 300 MG/ML  SOLN COMPARISON:  Aug 31, 2019 FINDINGS: Lower chest: No acute abnormality. Hepatobiliary: No focal liver abnormality is seen. No gallstones, gallbladder wall thickening, or biliary dilatation. Pancreas: Unremarkable. No pancreatic ductal dilatation or surrounding inflammatory changes. Spleen: Normal in size without focal abnormality.  Adrenals/Urinary Tract: Right-sided renal cyst, bilateral too small to be actually characterized few mm hypoattenuated renal lesions. No evidence of hydronephrosis or nephrolithiasis. Normal urinary bladder. Stomach/Bowel: Normal appearance of the stomach and proximal small bowel. Fluid-filled moderately dilated distal ileal loops with symmetric mucosal thickening. Associated mesenteric stranding. Moderate to large pelvic ascites. A transitional point is identified in the lower central abdomen, best seen on the coronal view. The colon is decompressed. The maximum diameter of  the abnormal small bowel loops is 2.6 cm, which is sub pathologic by CT criteria. Vascular/Lymphatic: No significant vascular findings are present. No enlarged abdominal or pelvic lymph nodes. Reproductive: Status post hysterectomy. No adnexal masses. Other: No abdominal wall hernia or abnormality. No abdominopelvic ascites. Musculoskeletal: No acute or significant osseous findings. IMPRESSION: 1. Fluid-filled mildly dilated distal ileal loops with symmetric mucosal thickening, and associated mesenteric stranding and moderate to large pelvic ascites. A transitional point is identified in the lower central abdomen, best seen on the coronal view. The colon is decompressed. These findings may represent infectious or inflammatory enteritis. Electronically Signed: By: Ted Mcalpine M.D. On: 09/18/2019 14:25    Anti-infectives: Anti-infectives (From admission, onward)   Start     Dose/Rate Route Frequency Ordered Stop   09/18/19 1615  cefTRIAXone (ROCEPHIN) 1 g in sodium chloride 0.9 % 100 mL IVPB     1 g 200 mL/hr over 30 Minutes Intravenous  Once 09/18/19 1611 09/18/19 1739   09/18/19 1615  metroNIDAZOLE (FLAGYL) IVPB 500 mg     500 mg 100 mL/hr over 60 Minutes Intravenous  Once 09/18/19 1611 09/18/19 1852       Assessment/Plan HTN Pre-diabetes Brain stem AVM Dementia   Abdominal pain SBO vs enteritis with reactive ileus  - check abdominal film this AM  - abdominal exam completely benign  - I think could likely start clears today if film looks ok - no indication for surgical intervention at this time  FEN: NPO, IVF VTE: SCDs IDL rocephin/flagyl 5/23>>  LOS: 1 day    Juliet Rude , [facility] 09/19/2019, 11:04 AM Please see Amion for pager number during day hours 7:00am-4:30pm  Agree with above.  He abdominal exam is benign.  She has taken liquids today.  Husband in the room.  Sister, Jola Babinski, in the room.  And they got the daughter on the  phone.  Ovidio Kin, MD, [facility] Office phone:  [phone]

## 2019-09-19 NOTE — Progress Notes (Signed)
PROGRESS NOTE  Melanie Kaiser KGY:[id number] DOB: 1951-03-25 DOA: 09/18/2019 PCP: Cari Caraway, MD  HPI/Recap of past 24 hours: Melanie Kaiser is a 69 y.o. female with medical history significant for dementia, hypertension, prediabetes, and admission in March with possible low-grade SBO, now presenting to the emergency department with 1 day of abdominal pain, nausea, and vomiting.  Patient had had a small bowel movement earlier and went on to complain of abdominal pain throughout the day with some nausea and nonbloody vomiting.  She had been in her usual state when going to bed last night. Her husband provides most of the history and notes that this is the first time he has seen her vomit after 60 years of marriage. There has not been any diarrhea, melena, or hematochezia.    [facility] ED Course: Upon arrival to the ED, patient is found to be afebrile, saturating mid 90s on room air, and with stable blood pressure.  EKG features sinus rhythm.  Chemistry panel with mild elevation in transaminases, elevated albumin and total protein, and mild elevation in lipase.  CBC with leukocytosis to 13,900.  Lactic acid is normal.  Urinalysis with low specific gravity.  Blood and urine cultures were collected, 1 L of saline was administered, and the patient was treated with morphine, Zofran, Rocephin, and Flagyl in the ED.  Surgery was consulted by the ED physician and recommended medical admission.  09/19/19: Seen and examined with her husband at bedside.  Unable to obtain a history due to advanced dementia.  Denies any abdominal pain at the time of this visit.   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 Continue IV fluid Continue to replete electrolytes as indicated, goal potassium greater than 4.0, goal magnesium greater than 2.0  Leukocytosis, suspect reactive in the setting of partial SBO Leukocytosis has resolved Received IV antibiotics on presentation in the ED Currently not on antibiotic  Hypokalemia Potassium 3.3 Repleted Magnesium 1.8  Mildly elevated ALT Trending down AST has normalized Alkaline phos and T bilirubin within normal  Dementia with agitation Per husband she can become agitated She takes Seroquel, resume Her husband she has been weaned off of benzodiazepines by her PCP Avoid benzodiazepine as it can worsen agitation and increase risk of fall Continue delirium precautions  Essential hypertension BP stable Pending med rec reconciliation  Prediabetes A1c 5.8 on March 2021 Currently CBGs have been stable. Avoid hypoglycemia  Physical debility PT OT to assess Fall precautions   DVT prophylaxis: SCDs; subcu Lovenox daily Code Status: Full  Family Communication: Husband updated at bedside   Disposition Plan:  Patient is from: Home  Anticipated d/c is to: Home  Anticipated d/c date is: 09/20/19 Patient currently: Pending GI evaluation and ability to tolerate p.o.  Consults called: Surgery, GI.  Status is: Inpatient          Objective: Vitals:   09/18/19 1800 09/18/19 1807 09/18/19 1903 09/19/19 0420  BP: (!) 172/116 (!) 172/116 (!) 121/55 137/71  Pulse: 79 73 72 67  Resp: (!) 25 (!) 30 16 18   Temp:   98.6 F (37 C) 98.6 F (37 C)  TempSrc:  Oral  SpO2: 94% 95% 97% 96%  Weight:      Height:        Intake/Output Summary (Last 24 hours) at 09/19/2019 1124 Last data filed at 09/19/2019 1000 Gross per 24 hour  Intake 1221.27 ml  Output 100 ml  Net 1121.27 ml   Filed Weights   09/18/19 1153  Weight: 84.8 kg     Exam:  . General: 69 y.o. year-old female well developed well nourished in no acute distress.  Alert and confused in a state of dementia. . Cardiovascular: Regular rate and rhythm with no rubs or gallops.  No thyromegaly or JVD noted.   Marland Kitchen Respiratory: Clear to auscultation with no wheezes or rales. Good inspiratory effort. . Abdomen: Soft nontender nondistended with normal bowel sounds x4 quadrants. . Musculoskeletal: No lower extremity edema. 2/4 pulses in all 4 extremities. Marland Kitchen Psychiatry: Mood is appropriate for condition and setting   Data Reviewed: CBC: Recent Labs  Lab 09/18/19 1239 09/19/19 0321  WBC 13.9* 10.1  NEUTROABS 12.2* 7.2  HGB 12.7 10.4*  HCT 39.6 33.0*  MCV 86.3 88.2  PLT 209 185   Basic Metabolic Panel: Recent Labs  Lab 09/18/19 1239 09/19/19 0321  NA 137 138  K 4.7 3.3*  CL 98 104  CO2 25 27  GLUCOSE 127* 121*  BUN 16 14  CREATININE 0.86 0.77  CALCIUM 9.7 8.8*  MG  --  1.8  PHOS  --  3.2   GFR: Estimated Creatinine Clearance: 67.6 mL/min (by C-G formula based on SCr of 0.77 mg/dL). Liver Function Tests: Recent Labs  Lab 09/18/19 1239 09/19/19 0321  AST 67* 33  ALT 74* 53*  ALKPHOS 78 59  BILITOT 1.2 0.7  PROT 8.9* 6.7  ALBUMIN 5.1* 3.9   Recent Labs  Lab 09/18/19 1239 09/19/19 0321  LIPASE 63* 21   No results for input(s): AMMONIA in the last 168 hours. Coagulation Profile: No results for input(s): INR, PROTIME in the last 168 hours. Cardiac Enzymes: No results for input(s): CKTOTAL, CKMB, CKMBINDEX, TROPONINI in the last 168 hours. BNP (last 3 results) No results for input(s): PROBNP in the last 8760 hours. HbA1C: No results for input(s): HGBA1C in the last 72 hours. CBG: Recent Labs  Lab 09/18/19 2024 09/18/19 2357 09/19/19 0417 09/19/19 0730  GLUCAP 104* 104* 117* 126*   Lipid Profile: No results for input(s): CHOL, HDL, LDLCALC, TRIG, CHOLHDL, LDLDIRECT in the last 72 hours. Thyroid Function Tests: No results  for input(s): TSH, T4TOTAL, FREET4, T3FREE, THYROIDAB in the last 72 hours. Anemia Panel: No results for input(s): VITAMINB12, FOLATE, FERRITIN, TIBC, IRON, RETICCTPCT in the last 72 hours. Urine analysis:    Component Value Date/Time   COLORURINE YELLOW 09/18/2019 1654   APPEARANCEUR CLEAR 09/18/2019 1654   LABSPEC <1.005 (L) 09/18/2019 1654   PHURINE 6.0 09/18/2019 1654   GLUCOSEU NEGATIVE 09/18/2019 1654   HGBUR NEGATIVE 09/18/2019 1654   BILIRUBINUR NEGATIVE 09/18/2019 1654   KETONESUR NEGATIVE 09/18/2019 1654   PROTEINUR NEGATIVE 09/18/2019 1654   NITRITE NEGATIVE 09/18/2019 1654   LEUKOCYTESUR NEGATIVE 09/18/2019 1654   Sepsis Labs: @LABRCNTIP (procalcitonin:4,lacticidven:4)  ) Recent Results (from the past 240 hour(s))  Culture, blood (Routine X 2) w Reflex to ID Panel     Status: None (Preliminary result)   Collection Time: 09/18/19  4:28 PM   Specimen: BLOOD  Result Value Ref Range Status   Specimen Description   Final    BLOOD RIGHT ANTECUBITAL Performed at [facility],  [address] [place], [place] [address]    Special Requests   Final    BOTTLES DRAWN AEROBIC ONLY Blood Culture results may not be optimal due to an inadequate volume of blood received in culture bottles Performed at [facility], [address] [place], [place] [address]    Culture   Final    NO GROWTH < 12 HOURS Performed at [facility], [address] [place], [place] [address]    Report Status PENDING  Incomplete  SARS Coronavirus 2 by RT PCR (hospital order, performed in [facility] hospital lab) Nasopharyngeal Urine, Catheterized     Status: None   Collection Time: 09/18/19  4:55 PM   Specimen: Urine, Catheterized; Nasopharyngeal  Result Value Ref Range Status   SARS Coronavirus 2 NEGATIVE NEGATIVE Final    Comment: (NOTE) SARS-CoV-2 target nucleic acids are NOT DETECTED. The SARS-CoV-2 RNA is generally detectable in upper and lower respiratory specimens  during the acute phase of infection. The lowest concentration of SARS-CoV-2 viral copies this assay can detect is 250 copies / mL. A negative result does not preclude SARS-CoV-2 infection and should not be used as the sole basis for treatment or other patient management decisions.  A negative result may occur with improper specimen collection / handling, submission of specimen other than nasopharyngeal swab, presence of viral mutation(s) within the areas targeted by this assay, and inadequate number of viral copies (<250 copies / mL). A negative result must be combined with clinical observations, patient history, and epidemiological information. Fact Sheet for Patients:   BoilerBrush.com.cy Fact Sheet for Healthcare Providers: https://pope.com/ This test is not yet approved or cleared  by the [place] FDA and has been authorized for detection and/or diagnosis of SARS-CoV-2 by FDA under an Emergency Use Authorization (EUA).  This EUA will remain in effect (meaning this test can be used) for the duration of the COVID-19 declaration under Section 564(b)(1) of the Act, 21 U.S.C. section 360bbb-3(b)(1), unless the authorization is terminated or revoked sooner. Performed at [facility], [address] [place], [place] [address]   Culture, blood (Routine X 2) w Reflex to ID Panel     Status: None (Preliminary result)   Collection Time: 09/18/19  5:12 PM   Specimen: BLOOD  Result Value Ref Range Status   Specimen Description   Final    BLOOD BLOOD LEFT ARM Performed at [facility], [address] [place], [place] [address]    Special Requests   Final    BOTTLES DRAWN AEROBIC ONLY Blood Culture results may not be optimal due to an inadequate volume of blood received in culture bottles Performed at [facility], [address] [place], [place] [address]    Culture   Final    NO GROWTH < 12  HOURS Performed at [facility], [address] [place], [place] [address]    Report Status PENDING  Incomplete      Studies: CT ABDOMEN PELVIS W CONTRAST  Addendum Date: 09/18/2019   ADDENDUM REPORT: 09/18/2019 15:57 ADDENDUM: Such appearance could be caused by low grade, incomplete small bowel obstruction at the level of the distal ileum. Does the patient have known inflammatory bowel disease? Electronically Signed   By: Ted Mcalpine M.D.   On: 09/18/2019 15:57   Result Date: 09/18/2019 CLINICAL DATA:  Abdominal pain, nausea vomiting. EXAM: CT ABDOMEN AND PELVIS WITH CONTRAST TECHNIQUE: Multidetector CT  imaging of the abdomen and pelvis was performed using the standard protocol following bolus administration of intravenous contrast. CONTRAST:  OMNIPAQUE IOHEXOL 300 MG/ML  SOLN COMPARISON:  Aug 31, 2019 FINDINGS: Lower chest: No acute abnormality. Hepatobiliary: No focal liver abnormality is seen. No gallstones, gallbladder wall thickening, or biliary dilatation. Pancreas: Unremarkable. No pancreatic ductal dilatation or surrounding inflammatory changes. Spleen: Normal in size without focal abnormality. Adrenals/Urinary Tract: Right-sided renal cyst, bilateral too small to be actually characterized few mm hypoattenuated renal lesions. No evidence of hydronephrosis or nephrolithiasis. Normal urinary bladder. Stomach/Bowel: Normal appearance of the stomach and proximal small bowel. Fluid-filled moderately dilated distal ileal loops with symmetric mucosal thickening. Associated mesenteric stranding. Moderate to large pelvic ascites. A transitional point is identified in the lower central abdomen, best seen on the coronal view. The colon is decompressed. The maximum diameter of the abnormal small bowel loops is 2.6 cm, which is sub pathologic by CT criteria. Vascular/Lymphatic: No significant vascular findings are present. No enlarged abdominal or pelvic lymph nodes. Reproductive: Status  post hysterectomy. No adnexal masses. Other: No abdominal wall hernia or abnormality. No abdominopelvic ascites. Musculoskeletal: No acute or significant osseous findings. IMPRESSION: 1. Fluid-filled mildly dilated distal ileal loops with symmetric mucosal thickening, and associated mesenteric stranding and moderate to large pelvic ascites. A transitional point is identified in the lower central abdomen, best seen on the coronal view. The colon is decompressed. These findings may represent infectious or inflammatory enteritis. Electronically Signed: By: Ted Mcalpine M.D. On: 09/18/2019 14:25    Scheduled Meds: . insulin aspart  0-6 Units Subcutaneous Q4H  . QUEtiapine  100 mg Oral QHS  . QUEtiapine  50 mg Oral Daily    Continuous Infusions: . sodium chloride 90 mL/hr at 09/18/19 2306     LOS: 1 day     Darlin Drop, MD [facility] Pager [phone]  If 7PM-7AM, please contact night-coverage [email or web address] Password [facility] 09/19/2019, 11:24 AM

## 2019-09-19 NOTE — Progress Notes (Signed)
Pt confused, dementia, restless. Pt's husband stated she takes Ativan .25 q a.m. and Seroquel 50 q a.m., 100 q h.s. Pt's husband stated we will not be able to control her is she does not receive these medications. Husband states he does not want pt to receive heavy narcotics. Paged Linton Flemings.

## 2019-09-19 NOTE — Consult Note (Addendum)
Reason for Consult: Abnormal CT scan-enteritis Referring Physician: [facility] hospitalist  Melanie Kaiser is an 69 y.o. female.  HPI: Melanie Kaiser is a 69 year old black female with multiple medical problems listed below who presents to the hospital with nausea vomiting and abdominal pain was noted to have a low-grade bowel obstruction versus inflammation in the small bowel with a transition point. Today she seems to be doing well better and is tolerating clears well she denies having any abdominal pain nausea vomiting. She has dementia so most of the history is been procured from the patient's husband was at the bedside. Patient received Rocephin and Flagyl in the emergency room. She had elevated white count with 13, 900 WBC's hemoglobin of 12.7 g/dL.  Her transaminases slightly elevated with a AST of 67 ALT of 74 and alkaline phosphatase was 78.  Lactic acid levels were normal. On reviewing the patient's record she had a similar admission in March of this year for small bowel obstruction treated conservatively.   Past Medical History:  Diagnosis Date  . Allergic rhinitis allergic   allergic rhinitis  . 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 Depression/advanced dementia   . Diabetes mellitus   . Hypertension    Past Surgical History:  Procedure Laterality Date  . ABDOMINAL HYSTERECTOMY     vaginal  . BRAIN AVM REPAIR     Family History  Problem Relation Age of Onset  . Mental illness Father   . Stroke Father    Social History:  reports that she has never smoked. She has never used smokeless tobacco. She reports that she does not drink alcohol or use drugs.  Allergies: No Known Allergies  Medications: I have reviewed the patient's current medications.  Results for orders placed or performed during the hospital encounter of 09/18/19 (from the past 48 hour(s))  CBC with Differential     Status: Abnormal   Collection Time: 09/18/19 12:39 PM  Result Value  Ref Range   WBC 13.9 (H) 4.0 - 10.5 K/uL   RBC 4.59 3.87 - 5.11 MIL/uL   Hemoglobin 12.7 12.0 - 15.0 g/dL   HCT 41.9 37.9 - 02.4 %   MCV 86.3 80.0 - 100.0 fL   MCH 27.7 26.0 - 34.0 pg   MCHC 32.1 30.0 - 36.0 g/dL   RDW 09.7 35.3 - 29.9 %   Platelets 209 150 - 400 K/uL   nRBC 0.0 0.0 - 0.2 %   Neutrophils Relative % 88 %   Neutro Abs 12.2 (H) 1.7 - 7.7 K/uL   Lymphocytes Relative 9 %   Lymphs Abs 1.2 0.7 - 4.0 K/uL   Monocytes Relative 3 %   Monocytes Absolute 0.4 0.1 - 1.0 K/uL   Eosinophils Relative 0 %   Eosinophils Absolute 0.0 0.0 - 0.5 K/uL   Basophils Relative 0 %   Basophils Absolute 0.0 0.0 - 0.1 K/uL   Immature Granulocytes 0 %   Abs Immature Granulocytes 0.06 0.00 - 0.07 K/uL    Comment: Performed at [facility], [address] [place], [place] [address]  Comprehensive metabolic panel     Status: Abnormal   Collection Time: 09/18/19 12:39 PM  Result Value Ref Range   Sodium 137 135 - 145 mmol/L   Potassium 4.7 3.5 - 5.1 mmol/L    Comment: SLIGHT HEMOLYSIS   Chloride 98 98 - 111 mmol/L   CO2 25 22 - 32 mmol/L   Glucose, Bld 127 (H)  70 - 99 mg/dL    Comment: Glucose reference range applies only to samples taken after fasting for at least 8 hours.   BUN 16 8 - 23 mg/dL   Creatinine, Ser 5.42 0.44 - 1.00 mg/dL   Calcium 9.7 8.9 - 70.6 mg/dL   Total Protein 8.9 (H) 6.5 - 8.1 g/dL   Albumin 5.1 (H) 3.5 - 5.0 g/dL   AST 67 (H) 15 - 41 U/L   ALT 74 (H) 0 - 44 U/L   Alkaline Phosphatase 78 38 - 126 U/L   Total Bilirubin 1.2 0.3 - 1.2 mg/dL   GFR calc non Af Amer >60 >60 mL/min   GFR calc Af Amer >60 >60 mL/min   Anion gap 14 5 - 15    Comment: Performed at [facility], [address] [place], [place] [address]  Lipase, blood     Status: Abnormal   Collection Time: 09/18/19 12:39 PM  Result Value Ref Range   Lipase 63 (H) 11 - 51 U/L    Comment: Performed at [facility], [address] [place], [place] [address]  Troponin I  (High Sensitivity)     Status: None   Collection Time: 09/18/19 12:39 PM  Result Value Ref Range   Troponin I (High Sensitivity) 5 <18 ng/L    Comment: (NOTE) Elevated high sensitivity troponin I (hsTnI) values and significant  changes across serial measurements may suggest ACS but many other  chronic and acute conditions are known to elevate hsTnI results.  Refer to the "Links" section for chest pain algorithms and additional  guidance. Performed at [facility], [address] [place], [place] [address]   Culture, blood (Routine X 2) w Reflex to ID Panel     Status: None (Preliminary result)   Collection Time: 09/18/19  4:28 PM   Specimen: BLOOD  Result Value Ref Range   Specimen Description      BLOOD RIGHT ANTECUBITAL Performed at [facility], [address] [place], [place] [address]    Special Requests      BOTTLES DRAWN AEROBIC ONLY Blood Culture results may not be optimal due to an inadequate volume of blood received in culture bottles Performed at [facility], [address] [place], [place] [address]    Culture      NO GROWTH < 24 HOURS Performed at [facility], [address] [place], [place] [address]    Report Status PENDING   Lactic acid, plasma     Status: None   Collection Time: 09/18/19  4:28 PM  Result Value Ref Range   Lactic Acid, Venous 1.6 0.5 - 1.9 mmol/L    Comment: Performed at [facility], [address] [place], [place] [address]  Urinalysis, Routine w reflex microscopic     Status: Abnormal   Collection Time: 09/18/19  4:54 PM  Result Value Ref Range   Color, Urine YELLOW YELLOW   APPearance CLEAR CLEAR   Specific Gravity, Urine <1.005 (L) 1.005 - 1.030   pH 6.0 5.0 - 8.0   Glucose, UA NEGATIVE NEGATIVE mg/dL   Hgb urine dipstick NEGATIVE NEGATIVE   Bilirubin Urine NEGATIVE NEGATIVE   Ketones, ur NEGATIVE NEGATIVE mg/dL   Protein, ur NEGATIVE NEGATIVE mg/dL   Nitrite NEGATIVE NEGATIVE    Leukocytes,Ua NEGATIVE NEGATIVE    Comment: Microscopic not done on urines with negative protein, blood, leukocytes, nitrite,  or glucose < 500 mg/dL. Performed at [facility], [address] [place], [place] [address]   SARS Coronavirus 2 by RT PCR (hospital order, performed in [facility] hospital lab) Nasopharyngeal Urine, Catheterized     Status: None   Collection Time: 09/18/19  4:55 PM   Specimen: Urine, Catheterized; Nasopharyngeal  Result Value Ref Range   SARS Coronavirus 2 NEGATIVE NEGATIVE    Comment: (NOTE) SARS-CoV-2 target nucleic acids are NOT DETECTED. The SARS-CoV-2 RNA is generally detectable in upper and lower respiratory specimens during the acute phase of infection. The lowest concentration of SARS-CoV-2 viral copies this assay can detect is 250 copies / mL. A negative result does not preclude SARS-CoV-2 infection and should not be used as the sole basis for treatment or other patient management decisions.  A negative result may occur with improper specimen collection / handling, submission of specimen other than nasopharyngeal swab, presence of viral mutation(s) within the areas targeted by this assay, and inadequate number of viral copies (<250 copies / mL). A negative result must be combined with clinical observations, patient history, and epidemiological information. Fact Sheet for Patients:   BoilerBrush.com.cy Fact Sheet for Healthcare Providers: https://pope.com/ This test is not yet approved or cleared  by the [place] FDA and has been authorized for detection and/or diagnosis of SARS-CoV-2 by FDA under an Emergency Use Authorization (EUA).  This EUA will remain in effect (meaning this test can be used) for the duration of the COVID-19 declaration under Section 564(b)(1) of the Act, 21 U.S.C. section 360bbb-3(b)(1), unless the authorization is terminated or revoked sooner. Performed at [facility], [address] [place], [place] [address]   Culture, blood (Routine X 2) w Reflex to ID Panel     Status: None (Preliminary result)   Collection Time: 09/18/19  5:12 PM   Specimen: BLOOD  Result Value Ref Range   Specimen Description      BLOOD BLOOD LEFT ARM Performed at [facility], [address] [place], [place] [address]    Special Requests      BOTTLES DRAWN AEROBIC ONLY Blood Culture results may not be optimal due to an inadequate volume of blood received in culture bottles Performed at [facility], [address] [place], [place] [address]    Culture      NO GROWTH < 24 HOURS Performed at [facility], [address] [place], [place] [address]    Report Status PENDING   Glucose, capillary     Status: Abnormal   Collection Time: 09/18/19  8:24 PM  Result Value Ref Range   Glucose-Capillary 104 (H) 70 - 99 mg/dL    Comment: Glucose reference range applies only to samples taken after fasting for at least 8 hours.  Glucose, capillary     Status: Abnormal   Collection Time: 09/18/19 11:57 PM  Result Value Ref Range   Glucose-Capillary 104 (H) 70 - 99 mg/dL    Comment: Glucose reference range applies only to samples taken after fasting for at least 8 hours.  Comprehensive metabolic panel     Status: Abnormal   Collection Time: 09/19/19  3:21 AM  Result Value Ref Range   Sodium 138 135 - 145 mmol/L   Potassium 3.3 (L) 3.5 - 5.1 mmol/L   Chloride 104 98 - 111 mmol/L   CO2 27 22 - 32 mmol/L   Glucose, Bld 121 (H) 70 - 99  mg/dL    Comment: Glucose reference range applies only to samples taken after fasting for at least 8 hours.   BUN 14 8 - 23 mg/dL   Creatinine, Ser 0.16 0.44 - 1.00 mg/dL   Calcium 8.8 (L) 8.9 - 10.3 mg/dL   Total Protein 6.7 6.5 - 8.1 g/dL   Albumin 3.9 3.5 - 5.0 g/dL   AST 33 15 - 41 U/L   ALT 53 (H) 0 - 44 U/L   Alkaline Phosphatase 59 38 - 126 U/L   Total Bilirubin 0.7 0.3 - 1.2 mg/dL   GFR calc  non Af Amer >60 >60 mL/min   GFR calc Af Amer >60 >60 mL/min   Anion gap 7 5 - 15    Comment: Performed at [facility], [address], [place], [place] [address]  Lipase, blood     Status: None   Collection Time: 09/19/19  3:21 AM  Result Value Ref Range   Lipase 21 11 - 51 U/L    Comment: Performed at [facility], [address] [place], [place] [address]  CBC WITH DIFFERENTIAL     Status: Abnormal   Collection Time: 09/19/19  3:21 AM  Result Value Ref Range   WBC 10.1 4.0 - 10.5 K/uL   RBC 3.74 (L) 3.87 - 5.11 MIL/uL   Hemoglobin 10.4 (L) 12.0 - 15.0 g/dL   HCT 32.2 (L) 02.5 - 42.7 %   MCV 88.2 80.0 - 100.0 fL   MCH 27.8 26.0 - 34.0 pg   MCHC 31.5 30.0 - 36.0 g/dL   RDW 06.2 37.6 - 28.3 %   Platelets 185 150 - 400 K/uL   nRBC 0.0 0.0 - 0.2 %   Neutrophils Relative % 72 %   Neutro Abs 7.2 1.7 - 7.7 K/uL   Lymphocytes Relative 21 %   Lymphs Abs 2.1 0.7 - 4.0 K/uL   Monocytes Relative 6 %   Monocytes Absolute 0.6 0.1 - 1.0 K/uL   Eosinophils Relative 1 %   Eosinophils Absolute 0.1 0.0 - 0.5 K/uL   Basophils Relative 0 %   Basophils Absolute 0.0 0.0 - 0.1 K/uL   Immature Granulocytes 0 %   Abs Immature Granulocytes 0.03 0.00 - 0.07 K/uL    Comment: Performed at [facility], [address], [place], [place] [address]  Magnesium     Status: None   Collection Time: 09/19/19  3:21 AM  Result Value Ref Range   Magnesium 1.8 1.7 - 2.4 mg/dL    Comment: Performed at [facility], [address] [place], [place] [address]  Phosphorus     Status: None   Collection Time: 09/19/19  3:21 AM  Result Value Ref Range   Phosphorus 3.2 2.5 - 4.6 mg/dL    Comment: Performed at [facility], [address] [place], [place] [address]  Glucose, capillary     Status: Abnormal   Collection Time: 09/19/19  4:17 AM  Result Value Ref Range   Glucose-Capillary 117 (H) 70 - 99 mg/dL    Comment:  Glucose reference range applies only to samples taken after fasting for at least 8 hours.  Glucose, capillary     Status: Abnormal   Collection Time: 09/19/19  7:30 AM  Result Value Ref Range   Glucose-Capillary 126 (H) 70 - 99 mg/dL    Comment: Glucose reference range applies only to samples taken after fasting for at least 8 hours.  Glucose,  capillary     Status: None   Collection Time: 09/19/19 11:50 AM  Result Value Ref Range   Glucose-Capillary 94 70 - 99 mg/dL    Comment: Glucose reference range applies only to samples taken after fasting for at least 8 hours.    CT ABDOMEN PELVIS W CONTRAST  Addendum Date: 09/18/2019   ADDENDUM REPORT: 09/18/2019 15:57 ADDENDUM: Such appearance could be caused by low grade, incomplete small bowel obstruction at the level of the distal ileum. Does the patient have known inflammatory bowel disease? Electronically Signed   By: Fidela Salisbury M.D.   On: 09/18/2019 15:57   Result Date: 09/18/2019 CLINICAL DATA:  Abdominal pain, nausea vomiting. EXAM: CT ABDOMEN AND PELVIS WITH CONTRAST TECHNIQUE: Multidetector CT imaging of the abdomen and pelvis was performed using the standard protocol following bolus administration of intravenous contrast. CONTRAST:  15mL OMNIPAQUE IOHEXOL 300 MG/ML  SOLN COMPARISON:  Aug 31, 2019 FINDINGS: Lower chest: No acute abnormality. Hepatobiliary: No focal liver abnormality is seen. No gallstones, gallbladder wall thickening, or biliary dilatation. Pancreas: Unremarkable. No pancreatic ductal dilatation or surrounding inflammatory changes. Spleen: Normal in size without focal abnormality. Adrenals/Urinary Tract: Right-sided renal cyst, bilateral too small to be actually characterized few mm hypoattenuated renal lesions. No evidence of hydronephrosis or nephrolithiasis. Normal urinary bladder. Stomach/Bowel: Normal appearance of the stomach and proximal small bowel. Fluid-filled moderately dilated distal ileal loops with symmetric  mucosal thickening. Associated mesenteric stranding. Moderate to large pelvic ascites. A transitional point is identified in the lower central abdomen, best seen on the coronal view. The colon is decompressed. The maximum diameter of the abnormal small bowel loops is 2.6 cm, which is sub pathologic by CT criteria. Vascular/Lymphatic: No significant vascular findings are present. No enlarged abdominal or pelvic lymph nodes. Reproductive: Status post hysterectomy. No adnexal masses. Other: No abdominal wall hernia or abnormality. No abdominopelvic ascites. Musculoskeletal: No acute or significant osseous findings. IMPRESSION: 1. Fluid-filled mildly dilated distal ileal loops with symmetric mucosal thickening, and associated mesenteric stranding and moderate to large pelvic ascites. A transitional point is identified in the lower central abdomen, best seen on the coronal view. The colon is decompressed. These findings may represent infectious or inflammatory enteritis. Electronically Signed: By: Fidela Salisbury M.D. On: 09/18/2019 14:25   DG Abd Portable 1V  Result Date: 09/19/2019 CLINICAL DATA:  Follow-up ileus EXAM: PORTABLE ABDOMEN - 1 VIEW COMPARISON:  09/18/2019 FINDINGS: Scattered large and small bowel gas is noted. Mildly prominent small bowel is noted although improved when compared with the prior exam. No free air is seen. No bony abnormality is noted. IMPRESSION: Mildly prominent small bowel although slightly improved when compared with the prior exam. Electronically Signed   By: Inez Catalina M.D.   On: 09/19/2019 12:23   Review of Systems  Constitutional: Negative.   HENT: Negative.   Eyes: Negative.   Respiratory: Negative.   Cardiovascular: Negative.   Gastrointestinal: Negative.   Endocrine: Negative.   Genitourinary: Negative.   Musculoskeletal: Positive for arthralgias.  Psychiatric/Behavioral: Positive for confusion. Negative for behavioral problems.   Blood pressure (!) 152/73,  pulse 73, temperature 98.3 F (36.8 C), resp. rate 18, height 5\' 3"  (1.6 m), weight 84.8 kg, SpO2 98 %. Physical Exam  Constitutional: She appears well-developed and well-nourished.  HENT:  Head: Normocephalic and atraumatic.  Eyes: Pupils are equal, round, and reactive to light. EOM are normal.  Cardiovascular: Normal rate and regular rhythm.  Respiratory: Effort normal and breath sounds normal.  GI:  Soft. Bowel sounds are normal.  Musculoskeletal:     Cervical back: Normal range of motion and neck supple.  Neurological: She is alert.  Skin: Skin is warm and dry.  Psychiatric: Her behavior is normal.   Assessment/Plan: 1) Nausea vomiting abdominal pain-abnormal CT scan showing some dilated loops with inflammation versus infection in the small bowel with a transition point noted-?  Enteritis versus ileus. Patient may benefit from a CT enterography.  I would strongly recommend discontinuation of all nonsteroidals including ketorolac as this can worsen inflammation in the small bowel.  Abdominal films done today showed improvement in the dilation of the small bowel. 2) Elevated transaminases-etiology unclear.  It will be helpful to repeat her liver panel tomorrow to make sure LFTs are improving. 3) History of depression/advanced dementia. 4) Brainstem AVM Melanie Kaiser 09/19/2019, 5:58 PM

## 2019-09-19 NOTE — Plan of Care (Signed)
  Problem: Education: Goal: Knowledge of General Education information will improve Description: Including pain rating scale, medication(s)/side effects and non-pharmacologic comfort measures 09/19/2019 1238 by Iantha Fallen, RN Outcome: Progressing 09/19/2019 1233 by Iantha Fallen, RN Outcome: Progressing   Problem: Health Behavior/Discharge Planning: Goal: Ability to manage health-related needs will improve 09/19/2019 1238 by Iantha Fallen, RN Outcome: Progressing 09/19/2019 1233 by Iantha Fallen, RN Outcome: Progressing   Problem: Clinical Measurements: Goal: Ability to maintain clinical measurements within normal limits will improve 09/19/2019 1238 by Iantha Fallen, RN Outcome: Progressing 09/19/2019 1233 by Iantha Fallen, RN Outcome: Progressing Goal: Will remain free from infection 09/19/2019 1238 by Iantha Fallen, RN Outcome: Progressing 09/19/2019 1233 by Iantha Fallen, RN Outcome: Progressing Goal: Diagnostic test results will improve 09/19/2019 1238 by Iantha Fallen, RN Outcome: Progressing 09/19/2019 1233 by Iantha Fallen, RN Outcome: Progressing

## 2019-09-19 NOTE — Progress Notes (Signed)
Per Linton Flemings, day attending to address Ativan and Seroquel medications. Toradol added to medications.

## 2019-09-19 NOTE — Evaluation (Signed)
Physical Therapy Evaluation Patient Details Name: Melanie Kaiser MRN: [id number] DOB: 1951/04/27 Today's Date: 09/19/2019   History of Present Illness  69 y.o. 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 Work up for SBO, vs infectious or inflammatory bowel process.  Clinical Impression   Pt presents with impaired safety awareness and task sequencing secondary to dementia, slow and steady gait, and WFL strength on PT eval. Pt ambulated great hallway distance without use of AD, pt's sister stating that pt does great with mobility at home and has no history of falls. Pt's main issues are related to dementia per pt's sister. PT will continue to follow for GI motility and mobility maintenance, but no PT needs post-acutely. PT to progress mobility as tolerated, and will continue to follow acutely.     Follow Up Recommendations No PT follow up;Supervision/Assistance - 24 hour    Equipment Recommendations  None recommended by PT    Recommendations for Other Services       Precautions / Restrictions Precautions Precautions: Fall Restrictions Weight Bearing Restrictions: No      Mobility  Bed Mobility Overal bed mobility: Needs Assistance Bed Mobility: Supine to Sit;Sit to Supine     Supine to sit: Supervision;HOB elevated Sit to supine: Supervision;HOB elevated   General bed mobility comments: Increased time due to difficulty with timely direction following, requires repeated cuing.  Transfers Overall transfer level: Needs assistance Equipment used: None Transfers: Sit to/from Stand Sit to Stand: Modified independent (Device/Increase time)         General transfer comment: Increased time to rise and self-steady, no phyiscal assist required.  Ambulation/Gait Ambulation/Gait assistance: Supervision Gait Distance (Feet): 450  Feet(x2) Assistive device: None Gait Pattern/deviations: Step-through pattern;WFL(Within Functional Limits) Gait velocity: decr   General Gait Details: supervision for safety, slightly slow but steady gait. Very easily distracted in hallway, but no LOB noted.  Stairs            Wheelchair Mobility    Modified Rankin (Stroke Patients Only)       Balance Overall balance assessment: Needs assistance Sitting-balance support: No upper extremity supported;Feet supported Sitting balance-Leahy Scale: Good     Standing balance support: No upper extremity supported Standing balance-Leahy Scale: Good Standing balance comment: ambulates without device, tolerates challenge well                             Pertinent Vitals/Pain Pain Assessment: No/denies pain    Home Living Family/patient expects to be discharged to:: Private residence Living Arrangements: Spouse/significant other Available Help at Discharge: Family Type of Home: House       Home Layout: One level Home Equipment: None      Prior Function Level of Independence: Needs assistance   Gait / Transfers Assistance Needed: Pt mobilizes without assist  ADL's / Homemaking Assistance Needed: Per pt's sister, pt just started receiving aide assist ~6 hours/day, 7 days/week as pt's husband was having difficulty managing with wife at home independently.        Hand Dominance   Dominant Hand: Right    Extremity/Trunk Assessment   Upper Extremity Assessment Upper Extremity Assessment: Defer to OT evaluation    Lower Extremity Assessment Lower Extremity Assessment: Overall WFL for tasks assessed    Cervical / Trunk Assessment Cervical / Trunk Assessment: Normal  Communication   Communication:  No difficulties  Cognition Arousal/Alertness: Awake/alert Behavior During Therapy: WFL for tasks assessed/performed Overall Cognitive Status: History of cognitive impairments - at baseline Area of  Impairment: Orientation;Attention;Memory;Following commands;Safety/judgement;Problem solving                 Orientation Level: Disoriented to;Place;Time;Situation Current Attention Level: Sustained Memory: Decreased short-term memory Following Commands: Follows one step commands consistently Safety/Judgement: Decreased awareness of safety   Problem Solving: Difficulty sequencing;Decreased initiation;Requires verbal cues General Comments: History of dementia at baseline, per sister pt gets restless/agitated at times especially when husband Alinda Money not present. Pt not oriented to hospital or reason for admission, cannot state how many siblings she has, and states there is a child in her bed. Pt asks PT who she is x2 during session and this PT reintroduced herself each time.      General Comments      Exercises     Assessment/Plan    PT Assessment Patient needs continued PT services  PT Problem List Decreased mobility;Decreased safety awareness;Decreased cognition;Decreased balance       PT Treatment Interventions Gait training;Therapeutic activities;Therapeutic exercise;Patient/family education;Functional mobility training;Balance training    PT Goals (Current goals can be found in the Care Plan section)  Acute Rehab PT Goals Patient Stated Goal: go home PT Goal Formulation: With patient Time For Goal Achievement: 10/03/19 Potential to Achieve Goals: Good    Frequency Min 3X/week   Barriers to discharge        Co-evaluation               AM-PAC PT "6 Clicks" Mobility  Outcome Measure Help needed turning from your back to your side while in a flat bed without using bedrails?: None Help needed moving from lying on your back to sitting on the side of a flat bed without using bedrails?: None Help needed moving to and from a bed to a chair (including a wheelchair)?: A Little Help needed standing up from a chair using your arms (e.g., wheelchair or bedside chair)?:  A Little Help needed to walk in hospital room?: A Little Help needed climbing 3-5 steps with a railing? : A Little 6 Click Score: 20    End of Session Equipment Utilized During Treatment: Gait belt Activity Tolerance: Patient tolerated treatment well Patient left: in bed;with call bell/phone within reach;with bed alarm set;with family/visitor present Nurse Communication: Mobility status PT Visit Diagnosis: Other abnormalities of gait and mobility (R26.89)    Time: 2423-5361 PT Time Calculation (min) (ACUTE ONLY): 35 min   Charges:   PT Evaluation $PT Eval Low Complexity: 1 Low PT Treatments $Gait Training: 8-22 mins       Lauris Keepers E, PT Acute Rehabilitation Services Pager [phone]  Office [phone]  Anagabriela Jokerst D Despina Hidden 09/19/2019, 4:52 PM

## 2019-09-20 LAB — COMPREHENSIVE METABOLIC PANEL
ALT: 46 U/L — ABNORMAL HIGH (ref 0–44)
AST: 29 U/L (ref 15–41)
Albumin: 3.9 g/dL (ref 3.5–5.0)
Alkaline Phosphatase: 55 U/L (ref 38–126)
Anion gap: 7 (ref 5–15)
BUN: 10 mg/dL (ref 8–23)
CO2: 25 mmol/L (ref 22–32)
Calcium: 8.8 mg/dL — ABNORMAL LOW (ref 8.9–10.3)
Chloride: 107 mmol/L (ref 98–111)
Creatinine, Ser: 0.66 mg/dL (ref 0.44–1.00)
GFR calc Af Amer: >60 mL/min (ref >60)
GFR calc non Af Amer: >60 mL/min (ref >60)
Glucose, Bld: 113 mg/dL — ABNORMAL HIGH (ref 70–99)
Potassium: 3.4 mmol/L — ABNORMAL LOW (ref 3.5–5.1)
Sodium: 139 mmol/L (ref 135–145)
Total Bilirubin: 0.6 mg/dL (ref 0.3–1.2)
Total Protein: 6.6 g/dL (ref 6.5–8.1)

## 2019-09-20 LAB — CBC WITH DIFFERENTIAL/PLATELET
Abs Immature Granulocytes: 0.02 10*3/uL (ref 0.00–0.07)
Basophils Absolute: 0 10*3/uL (ref 0.0–0.1)
Basophils Relative: 1 %
Eosinophils Absolute: 0.2 10*3/uL (ref 0.0–0.5)
Eosinophils Relative: 3 %
HCT: 29.6 % — ABNORMAL LOW (ref 36.0–46.0)
Hemoglobin: 9.2 g/dL — ABNORMAL LOW (ref 12.0–15.0)
Immature Granulocytes: 0 %
Lymphocytes Relative: 32 %
Lymphs Abs: 2.1 10*3/uL (ref 0.7–4.0)
MCH: 27.5 pg (ref 26.0–34.0)
MCHC: 31.1 g/dL (ref 30.0–36.0)
MCV: 88.6 fL (ref 80.0–100.0)
Monocytes Absolute: 0.4 10*3/uL (ref 0.1–1.0)
Monocytes Relative: 7 %
Neutro Abs: 3.8 10*3/uL (ref 1.7–7.7)
Neutrophils Relative %: 57 %
Platelets: 156 10*3/uL (ref 150–400)
RBC: 3.34 MIL/uL — ABNORMAL LOW (ref 3.87–5.11)
RDW: 15 % (ref 11.5–15.5)
WBC: 6.6 10*3/uL (ref 4.0–10.5)
nRBC: 0 % (ref 0.0–0.2)

## 2019-09-20 LAB — GLUCOSE, CAPILLARY
Glucose-Capillary: 103 mg/dL — ABNORMAL HIGH (ref 70–99)
Glucose-Capillary: 116 mg/dL — ABNORMAL HIGH (ref 70–99)
Glucose-Capillary: 118 mg/dL — ABNORMAL HIGH (ref 70–99)
Glucose-Capillary: 122 mg/dL — ABNORMAL HIGH (ref 70–99)
Glucose-Capillary: 174 mg/dL — ABNORMAL HIGH (ref 70–99)

## 2019-09-20 LAB — URINE CULTURE: Culture: NO GROWTH

## 2019-09-20 MED ORDER — POTASSIUM CHLORIDE CRYS ER 20 MEQ PO TBCR
40.0000 meq | EXTENDED_RELEASE_TABLET | Freq: Two times a day (BID) | ORAL | Status: AC
  Administered 2019-09-20 (×2): 40 meq via ORAL
  Filled 2019-09-20 (×2): qty 2

## 2019-09-20 MED ORDER — METOPROLOL SUCCINATE ER 25 MG PO TB24: 25.0000 mg | ORAL_TABLET | Freq: Every day | ORAL | Status: DC

## 2019-09-20 MED ORDER — METOPROLOL SUCCINATE ER 25 MG PO TB24
12.5000 mg | ORAL_TABLET | Freq: Every day | ORAL | Status: DC
  Administered 2019-09-20 – 2019-09-21 (×2): 12.5 mg via ORAL
  Filled 2019-09-20 (×2): qty 1

## 2019-09-20 MED ORDER — METOPROLOL SUCCINATE ER 25 MG PO TB24: 12.5000 mg | ORAL_TABLET | Freq: Once | ORAL | Status: DC

## 2019-09-20 NOTE — Progress Notes (Signed)
Subjective: No reports of any abdominal pain.  She is currently eating a soft diet.  Objective: Vital signs in last 24 hours: Temp:  [97.8 F (36.6 C)-98.3 F (36.8 C)] 98.3 F (36.8 C) (05/25 1426) Pulse Rate:  [76-85] 85 (05/25 1426) Resp:  [17-18] 17 (05/25 1426) BP: (137-160)/(57-86) 160/86 (05/25 1426) SpO2:  [99 %] 99 % (05/25 1426) Last BM Date: 09/20/19  Intake/Output from previous day: 05/24 0701 - 05/25 0700 In: 2675.7 [P.O.:600; I.V.:1946.1; IV Piggyback:129.6] Out: 2900 [Urine:2900] Intake/Output this shift: Total I/O In: 832.9 [P.O.:240; I.V.:592.9] Out: 500 [Urine:500]  General appearance: alert and no distress GI: soft, non-tender; bowel sounds normal; no masses,  no organomegaly  Lab Results: Recent Labs    09/18/19 1239 09/19/19 0321 09/20/19 0343  WBC 13.9* 10.1 6.6  HGB 12.7 10.4* 9.2*  HCT 39.6 33.0* 29.6*  PLT 209 185 156   BMET Recent Labs    09/18/19 1239 09/19/19 0321 09/20/19 0343  NA 137 138 139  K 4.7 3.3* 3.4*  CL 98 104 107  CO2 25 27 25   GLUCOSE 127* 121* 113*  BUN 16 14 10   CREATININE 0.86 0.77 0.66  CALCIUM 9.7 8.8* 8.8*   LFT Recent Labs    09/20/19 0343  PROT 6.6  ALBUMIN 3.9  AST 29  ALT 46*  ALKPHOS 55  BILITOT 0.6   PT/INR No results for input(s): LABPROT, INR in the last 72 hours. Hepatitis Panel No results for input(s): HEPBSAG, HCVAB, HEPAIGM, HEPBIGM in the last 72 hours. C-Diff No results for input(s): CDIFFTOX in the last 72 hours. Fecal Lactopherrin No results for input(s): FECLLACTOFRN in the last 72 hours.  Studies/Results: DG Abd Portable 1V  Result Date: 09/19/2019 CLINICAL DATA:  Follow-up ileus EXAM: PORTABLE ABDOMEN - 1 VIEW COMPARISON:  09/18/2019 FINDINGS: Scattered large and small bowel gas is noted. Mildly prominent small bowel is noted although improved when compared with the prior exam. No free air is seen. No bony abnormality is noted. IMPRESSION: Mildly prominent small bowel although  slightly improved when compared with the prior exam. Electronically Signed   By: 09/21/2019 M.D.   On: 09/19/2019 12:23    Medications:  Scheduled: . enoxaparin (LOVENOX) injection  40 mg Subcutaneous Q24H  . LORazepam  0.25 mg Oral QHS  . metoprolol succinate  12.5 mg Oral Daily  . potassium chloride  40 mEq Oral BID  . QUEtiapine  100 mg Oral QHS  . QUEtiapine  50 mg Oral Daily   Continuous: . dextrose 5% lactated ringers with KCl 20 mEq/L 75 mL/hr at 09/19/19 2256    Assessment/Plan: 1) SBO - Appears to be resolved. 2) Dementia.   Clinically she appears to be well.  No further intervention at this time point. Her diet can be advanced as tolerated.  Signing off.  LOS: 2 days   Margie Brink D 09/20/2019, 4:36 PM

## 2019-09-20 NOTE — Progress Notes (Addendum)
Pt's husband expressed concern regarding the pt's blood sugar being checked. The husband reported that the pt is not a diabetic. Husband began raising his voice and said we don't know what we're doing. CN made aware and spoke to pt. Dow Adolph, DO paged regarding the situation.   DO gave verbal orders to d/c CBG's and Novolog.

## 2019-09-20 NOTE — Progress Notes (Signed)
Charna Elizabeth, MD gave verbal orders to advance pt to a soft diet.

## 2019-09-20 NOTE — Evaluation (Signed)
Occupational Therapy Evaluation Patient Details Name: Melanie Kaiser MRN: [id number] DOB: Jan 05, 1951 Today's Date: 09/20/2019    History of Present Illness 69 y.o. 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 Work up for SBO, vs infectious or inflammatory bowel process.   Clinical Impression   OT eval complete. Pt overall A with ADL activity. No further OT needed. Husband provides 24/7 A    Follow Up Recommendations  No OT follow up;Supervision/Assistance - 24 hour    Equipment Recommendations  None recommended by OT    Recommendations for Other Services       Precautions / Restrictions Precautions Precautions: Fall      Mobility Bed Mobility Overal bed mobility: Needs Assistance Bed Mobility: Supine to Sit;Sit to Supine     Supine to sit: Supervision;HOB elevated Sit to supine: Supervision;HOB elevated   General bed mobility comments: Increased time due to difficulty with timely direction following, requires repeated cuing.  Transfers Overall transfer level: Needs assistance Equipment used: None Transfers: Sit to/from [facility] Sit to Stand: Modified independent (Device/Increase time)         General transfer comment: Increased time to rise and self-steady, no phyiscal assist required.    Balance Overall balance assessment: Needs assistance Sitting-balance support: No upper extremity supported;Feet supported Sitting balance-Leahy Scale: Good     Standing balance support: No upper extremity supported Standing balance-Leahy Scale: Good Standing balance comment: ambulates without device, tolerates challenge well                           ADL either performed or assessed with clinical judgement   ADL Overall ADL's : At baseline                                              Vision Patient Visual Report: No change from baseline              Pertinent Vitals/Pain Pain Assessment: No/denies pain     Hand Dominance Right   Extremity/Trunk Assessment Upper Extremity Assessment Upper Extremity Assessment: Overall WFL for tasks assessed           Communication Communication Communication: No difficulties   Cognition Arousal/Alertness: Awake/alert Behavior During Therapy: WFL for tasks assessed/performed Overall Cognitive Status: History of cognitive impairments - at baseline Area of Impairment: Orientation;Attention;Memory;Following commands;Safety/judgement;Problem solving                 Orientation Level: Disoriented to;Place;Time;Situation Current Attention Level: Sustained Memory: Decreased short-term memory Following Commands: Follows one step commands consistently Safety/Judgement: Decreased awareness of safety   Problem Solving: Difficulty sequencing;Decreased initiation;Requires verbal cues General Comments: History of dementia at baseline, per sister pt gets restless/agitated at times especially when husband Melanie Kaiser not present. Pt not oriented to hospital or reason for admission, cannot state how many siblings she has, and states there is a child in her bed. Pt asks PT who she is x2 during session and this PT reintroduced herself each time.              Home Living Family/patient expects to be discharged to:: Private residence Living Arrangements: Spouse/significant other Available Help at Discharge: Family Type of Home: [facility]  Layout: One level               Home Equipment: None          Prior Functioning/Environment Level of Independence: Needs assistance  Gait / Transfers Assistance Needed: Pt mobilizes without assist ADL's / Homemaking Assistance Needed: Per pt's sister, pt just started receiving aide assist ~6 hours/day, 7 days/week as pt's husband was having difficulty managing with  wife at home independently.                     OT Goals(Current goals can be found in the care plan section) Acute Rehab OT Goals Patient Stated Goal: go home OT Goal Formulation: With patient  OT Frequency:      AM-PAC OT "6 Clicks" Daily Activity     Outcome Measure Help from another person eating meals?: None Help from another person taking care of personal grooming?: None Help from another person toileting, which includes using toliet, bedpan, or urinal?: A Little Help from another person bathing (including washing, rinsing, drying)?: A Little Help from another person to put on and taking off regular upper body clothing?: None Help from another person to put on and taking off regular lower body clothing?: A Little 6 Click Score: 21   End of Session Nurse Communication: Mobility status  Activity Tolerance: Patient tolerated treatment well Patient left: in chair;with nursing/sitter in room;with family/visitor present                   Time: 2778-2423 OT Time Calculation (min): 14 min Charges:  OT General Charges $OT Visit: 1 Visit OT Evaluation $OT Eval Moderate Complexity: 1 Mod  Lise Auer, OT Acute Rehabilitation Services [phone] Office- [phone]     Ghina Bittinger, Karin Golden D 09/20/2019, 5:31 PM

## 2019-09-20 NOTE — Progress Notes (Signed)
Pt had an additional small formed BM. Ambulating in hall.

## 2019-09-20 NOTE — Progress Notes (Signed)
PROGRESS NOTE  Melanie Kaiser TMB:[id number] DOB: Mar 05, 1951 DOA: 09/18/2019 PCP: Cari Caraway, MD  HPI/Recap of past 24 hours: Melanie Kaiser is a 69 y.o. female with medical history significant for dementia, hypertension, prediabetes, and admission in March with possible low-grade SBO, now presenting to the emergency department with 1 day of abdominal pain, nausea, and vomiting.  Patient had had a small bowel movement earlier and went on to complain of abdominal pain throughout the day with some nausea and nonbloody vomiting.  She had been in her usual state when going to bed last night. Her husband provides most of the history and notes that this is the first time he has seen her vomit after 44 years of marriage. There has not been any diarrhea, melena, or hematochezia.    [facility] ED Course: Upon arrival to the ED, patient is found to be afebrile, saturating mid 90s on room air, and with stable blood pressure.  EKG features sinus rhythm.  Chemistry panel with mild elevation in transaminases, elevated albumin and total protein, and mild elevation in lipase.  CBC with leukocytosis to 13,900.  Lactic acid is normal.  Urinalysis with low specific gravity.  Blood and urine cultures were collected, 1 L of saline was administered, and the patient was treated with morphine, Zofran, Rocephin, and Flagyl in the ED.  Surgery was consulted by the ED physician and recommended medical admission.  09/20/19:  Seen and examined with spouse present.  Reports abdominal pain but unable to elaborate further due to dementia.  CT enterography ordered.  Continue IV fluid.  Awaiting GI follow up evaluation.  Patient's husband would like to dc CBG checks and insulin sliding scale.    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 Collene Mares following CT enterography ordered as requested by GI, follow results Continue IV fluid Continue to replete electrolytes as indicated, goal potassium greater than 4.0, goal magnesium greater than 2.0  Resolved Leukocytosis, suspect reactive in the setting of partial SBO Leukocytosis has resolved Received IV antibiotics on presentation in the ED Currently not on antibiotic  Hypokalemia Potassium 3.3 Repleted intravenously and orally Magnesium 1.8  Mildly elevated ALT Trending down AST, alk phos, Tbili normal   Dementia with agitation Per husband she can become agitated She takes Seroquel, continue Her husband she has been weaned off of benzodiazepines by her PCP Avoid benzodiazepine as it can worsen agitation and increase risk of fall Continue delirium precautions  Essential hypertension BP stable Restart home toprol xl at lower dose then can titrate up Monitor vital signs  Prediabetes A1c 5.8 on March 2021 Currently CBGs have been stable. Avoid hypoglycemia DC ISS and CBG at husband's request  Physical debility PT OT to assess>> No PT follow up, needs 24 hour supervision and assistance due to dementia. Continue Fall precautions   DVT prophylaxis: SCDs; subcu Lovenox daily Code Status: Full  Family Communication: Husband updated at bedside   Disposition Plan:  Patient is from: Home  Anticipated d/c is to: Home  Anticipated d/c date is: 09/21/19 Patient currently: Pending GI follow up evaluation  Consults called: Surgery, GI.  Status is: Inpatient          Objective: Vitals:  09/19/19 1305 09/19/19 2150 09/20/19 0500 09/20/19 1426  BP: (!) 152/73 (!) 137/57 (!) 141/73 (!) 160/86  Pulse: 73 77 76 85  Resp: 18 18 18 17   Temp: 98.3 F (36.8 C) 97.8 F (36.6 C) 98.1 F (36.7 C) 98.3 F (36.8 C)    TempSrc:  Oral Oral Oral  SpO2: 98% 99% 99% 99%  Weight:      Height:        Intake/Output Summary (Last 24 hours) at 09/20/2019 1619 Last data filed at 09/20/2019 1400 Gross per 24 hour  Intake 2206.53 ml  Output 3100 ml  Net -893.47 ml   Filed Weights   09/18/19 1153  Weight: 84.8 kg    Exam:  . General: 69 y.o. year-old female well-developed well-nourished in no acute distress.  Alert and pleasantly demented. . Cardiovascular: Regular rate and rhythm no rubs or gallops.   Marland Kitchen Respiratory: Clear consultation no wheezes no rales. . Abdomen: Soft diffuse tenderness with palpation.  Bowel sounds present. . Musculoskeletal: No lower extremity edema bilaterally.   Marland Kitchen Psychiatry: Mood is appropriate for condition and setting.  Data Reviewed: CBC: Recent Labs  Lab 09/18/19 1239 09/19/19 0321 09/20/19 0343  WBC 13.9* 10.1 6.6  NEUTROABS 12.2* 7.2 3.8  HGB 12.7 10.4* 9.2*  HCT 39.6 33.0* 29.6*  MCV 86.3 88.2 88.6  PLT 209 185 712   Basic Metabolic Panel: Recent Labs  Lab 09/18/19 1239 09/19/19 0321 09/20/19 0343  NA 137 138 139  K 4.7 3.3* 3.4*  CL 98 104 107  CO2 25 27 25   GLUCOSE 127* 121* 113*  BUN 16 14 10   CREATININE 0.86 0.77 0.66  CALCIUM 9.7 8.8* 8.8*  MG  --  1.8  --   PHOS  --  3.2  --    GFR: Estimated Creatinine Clearance: 67.6 mL/min (by C-G formula based on SCr of 0.66 mg/dL). Liver Function Tests: Recent Labs  Lab 09/18/19 1239 09/19/19 0321 09/20/19 0343  AST 67* 33 29  ALT 74* 53* 46*  ALKPHOS 78 59 55  BILITOT 1.2 0.7 0.6  PROT 8.9* 6.7 6.6  ALBUMIN 5.1* 3.9 3.9   Recent Labs  Lab 09/18/19 1239 09/19/19 0321  LIPASE 63* 21   No results for input(s): AMMONIA in the last 168 hours. Coagulation Profile: No results for input(s): INR, PROTIME in the last 168 hours. Cardiac Enzymes: No results for input(s): CKTOTAL, CKMB, CKMBINDEX, TROPONINI in the last 168 hours. BNP (last 3 results) No results for input(s): PROBNP in the last  8760 hours. HbA1C: Recent Labs    09/19/19 0321  HGBA1C 6.2*   CBG: Recent Labs  Lab 09/19/19 2023 09/20/19 0007 09/20/19 0421 09/20/19 0734 09/20/19 1238  GLUCAP 129* 174* 103* 118* 122*   Lipid Profile: No results for input(s): CHOL, HDL, LDLCALC, TRIG, CHOLHDL, LDLDIRECT in the last 72 hours. Thyroid Function Tests: No results for input(s): TSH, T4TOTAL, FREET4, T3FREE, THYROIDAB in the last 72 hours. Anemia Panel: No results for input(s): VITAMINB12, FOLATE, FERRITIN, TIBC, IRON, RETICCTPCT in the last 72 hours. Urine analysis:    Component Value Date/Time   COLORURINE YELLOW 09/18/2019 [place] 09/18/2019 1654   LABSPEC <1.005 (L) 09/18/2019 1654   PHURINE 6.0 09/18/2019 1654   GLUCOSEU NEGATIVE 09/18/2019 1654   HGBUR NEGATIVE 09/18/2019 [place] 09/18/2019 1654   KETONESUR NEGATIVE 09/18/2019 1654   PROTEINUR NEGATIVE 09/18/2019 1654   NITRITE NEGATIVE 09/18/2019 [place] 09/18/2019 1654  Sepsis Labs: @LABRCNTIP (procalcitonin:4,lacticidven:4)  ) Recent Results (from the past 240 hour(s))  Culture, blood (Routine X 2) w Reflex to ID Panel     Status: None (Preliminary result)   Collection Time: 09/18/19  4:28 PM   Specimen: BLOOD  Result Value Ref Range Status   Specimen Description   Final    BLOOD RIGHT ANTECUBITAL Performed at [facility], [address] [place], [place] [address]    Special Requests   Final    BOTTLES DRAWN AEROBIC ONLY Blood Culture results may not be optimal due to an inadequate volume of blood received in culture bottles Performed at [facility], [address] [place], [place] [address]    Culture   Final    NO GROWTH 2 DAYS Performed at [facility], [place] [address] [place], [place] [address]    Report Status PENDING  Incomplete  Urine Culture     Status: None   Collection Time: 09/18/19  4:54 PM   Specimen: Urine, Random  Result Value  Ref Range Status   Specimen Description   Final    URINE, RANDOM Performed at [facility], [address] [place], [place] [address]    Special Requests   Final    NONE Performed at [facility], [address] [place], [place] [address]    Culture   Final    NO GROWTH Performed at [facility], [place] [address], [place], [place] [address]    Report Status 09/20/2019 FINAL  Final  SARS Coronavirus 2 by RT PCR (hospital order, performed in [facility] hospital lab) Nasopharyngeal Urine, Catheterized     Status: None   Collection Time: 09/18/19  4:55 PM   Specimen: Urine, Catheterized; Nasopharyngeal  Result Value Ref Range Status   SARS Coronavirus 2 NEGATIVE NEGATIVE Final    Comment: (NOTE) SARS-CoV-2 target nucleic acids are NOT DETECTED. The SARS-CoV-2 RNA is generally detectable in upper and lower respiratory specimens during the acute phase of infection. The lowest concentration of SARS-CoV-2 viral copies this assay can detect is 250 copies / mL. A negative result does not preclude SARS-CoV-2 infection and should not be used as the sole basis for treatment or other patient management decisions.  A negative result may occur with improper specimen collection / handling, submission of specimen other than nasopharyngeal swab, presence of viral mutation(s) within the areas targeted by this assay, and inadequate number of viral copies (<250 copies / mL). A negative result must be combined with clinical observations, patient history, and epidemiological information. Fact Sheet for Patients:   StrictlyIdeas.no Fact Sheet for Healthcare Providers: BankingDealers.co.za This test is not yet approved or cleared  by the [place] FDA and has been authorized for detection and/or diagnosis of SARS-CoV-2 by FDA under an Emergency Use Authorization (EUA).  This EUA will remain in effect (meaning this test can be  used) for the duration of the COVID-19 declaration under Section 564(b)(1) of the Act, 21 U.S.C. section 360bbb-3(b)(1), unless the authorization is terminated or revoked sooner. Performed at [facility], [address] [place], [place] [address]   Culture, blood (Routine X 2) w Reflex to ID Panel     Status: None (Preliminary result)   Collection Time: 09/18/19  5:12 PM   Specimen: BLOOD  Result Value Ref Range Status   Specimen Description   Final    BLOOD BLOOD LEFT ARM Performed  at [facility], [address] [place], [place] [address]    Special Requests   Final    BOTTLES DRAWN AEROBIC ONLY Blood Culture results may not be optimal due to an inadequate volume of blood received in culture bottles Performed at [facility], [address] [place], [place] [address]    Culture   Final    NO GROWTH 2 DAYS Performed at [facility], [place] [address] [place], [place] [address]    Report Status PENDING  Incomplete      Studies: No results found.  Scheduled Meds: . enoxaparin (LOVENOX) injection  40 mg Subcutaneous Q24H  . LORazepam  0.25 mg Oral QHS  . potassium chloride  40 mEq Oral BID  . QUEtiapine  100 mg Oral QHS  . QUEtiapine  50 mg Oral Daily    Continuous Infusions: . dextrose 5% lactated ringers with KCl 20 mEq/L 75 mL/hr at 09/19/19 2256     LOS: 2 days     Kayleen Memos, MD [facility] Pager [phone]  If 7PM-7AM, please contact night-coverage [email or web address] Password [facility] 09/20/2019, 4:19 PM

## 2019-09-20 NOTE — Progress Notes (Signed)
Pt tolerated full liquids well and had a small BM.

## 2019-09-20 NOTE — TOC Initial Note (Signed)
Transition of Care [facility]) - Initial/Assessment Note    Patient Details  Name: Melanie Kaiser MRN: [id number] Date of Birth: 03-Mar-1951  Transition of Care [facility]) CM/SW Contact:    Melanie Pall, LCSW Phone Number: 09/20/2019, 10:50 AM  Clinical Narrative:   Met with pt/ husband to review support resources (spouse requested SW consult).  Spouse presented all the programs he has tried with his wife over the past few years.  She has gone to Adult Day Care - pt did not do well; Respite living at [facility] - spouse brought her home due to visitor restrictions; and, currently, privately paying a caregiver who stays with them 8hrs/ day daily.  We discussed his VA connection, however, he has also met with them and found that they could not provide any additional resource/ caregiver support.  Spouse feels that the "best situation" is for her to remain in the home due to the degree of her dementia and the distress it causes for them both when they have tried programs outside of them home.  He has worked in the past with local [place], short term Technical sales engineer.  Spouse has met with Training and development officer and the [employer] as well.   Financially, they do not qualify for any ongoing DSS programs due to income.   Ultimately, pt's spouse has worked with all local resource/ programs that I could recommend to him.  He is financially ineligible for Medicaid.  This is an unfortunate situation as they are using "everything we had set aside" to cover the costs of in-home caregivers.  I have encouraged him to continue to try adult day programs as pt's dementia worsens and she may be less resistant/ aware of what is going on.  Thank you for the opportunity to meet and talk with this devoted spouse and I wish that I had more assistance to offer.               Expected Discharge Plan: Home/Self Care Barriers to Discharge: Continued Medical Work up   Patient Goals and CMS Choice Patient states their goals for this  hospitalization and ongoing recovery are:: pt with significant dementia      Expected Discharge Plan and Services Expected Discharge Plan: Home/Self Care In-house Referral: Clinical Social Work     Living arrangements for the past 2 months: Single Family Home                                      Prior Living Arrangements/Services Living arrangements for the past 2 months: Single Family Home Lives with:: Spouse Patient language and need for interpreter reviewed:: No Do you feel safe going back to the place where you live?: Yes      Need for Family Participation in Patient Care: Yes (Comment) Care giver support system in place?: Yes (comment)   Criminal Activity/Legal Involvement Pertinent to Current Situation/Hospitalization: No - Comment as needed  Activities of Daily Living Home Assistive Devices/Equipment: None ADL Screening (condition at time of admission) Patient's cognitive ability adequate to safely complete daily activities?: No Is the patient deaf or have difficulty hearing?: No Does the patient have difficulty seeing, even when wearing glasses/contacts?: No Does the patient have difficulty concentrating, remembering, or making decisions?: Yes Patient able to express need for assistance with ADLs?: Yes Does the patient have difficulty dressing or bathing?: Yes Independently performs ADLs?: No Communication: Independent Dressing (OT): Needs  assistance Is this a change from baseline?: Pre-admission baseline Grooming: Needs assistance Is this a change from baseline?: Pre-admission baseline Bathing: Needs assistance Is this a change from baseline?: Pre-admission baseline Toileting: Needs assistance Is this a change from baseline?: Pre-admission baseline In/Out Bed: Needs assistance Is this a change from baseline?: Pre-admission baseline Walks in Home: Needs assistance Is this a change from baseline?: Pre-admission baseline Does the patient have difficulty  walking or climbing stairs?: No Weakness of Legs: None Weakness of Arms/Hands: None  Permission Sought/Granted Permission sought to share information with : Family Supports Permission granted to share information with : Yes, Verbal Permission Granted  Share Information with NAME: Melanie Kaiser     Permission granted to share info w Relationship: spouse  Permission granted to share info w Contact Information: [phone]  Emotional Assessment Appearance:: Appears stated age Attitude/Demeanor/Rapport: Unresponsive(does not engage verbally) Affect (typically observed): Quiet Orientation: : Oriented to Self Alcohol / Substance Use: Not Applicable Psych Involvement: No (comment)  Admission diagnosis:  Enteritis [K52.9] Patient Active Problem List   Diagnosis Date Noted  . Small bowel obstruction, partial ([place]) 09/18/2019  . Enteritis 09/18/2019  . SBO (small bowel obstruction) ([place]) 07/02/2019  . Nausea with vomiting 07/01/2019  . Diarrhea 07/01/2019  . Abdominal pain 07/01/2019  . Diet-controlled type 2 diabetes mellitus ([place]) 10/22/2018  . 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 Gout 04/03/2017  . Allergic rhinitis due to other allergen 04/23/2006  . Benign essential hypertension 04/23/2006  . Depressive disorder, not elsewhere classified 04/23/2006  . Malformation 04/23/2006  . Paresthesia 04/23/2006   PCP:  Melanie Caraway, MD Pharmacy:   [id number] - [place], [place] - [address] AT [place] [address] [place] [id number] Phone: [phone] Fax: [phone]     Social Determinants of Health (SDOH) Interventions    Readmission Risk Interventions Readmission Risk Prevention Plan 09/20/2019  Transportation Screening Complete  PCP or Specialist Appt within 5-7 Days Complete  Home Care Screening Complete  Medication Review (RN CM) Complete  Some recent  data might be hidden

## 2019-09-20 NOTE — Progress Notes (Signed)
CT Entero Abd ordered. Patient would have to drink 1500 mL fluid to have test and is unable to do so. Test to take place in the a.m.

## 2019-09-20 NOTE — Final Consult Note (Addendum)
[facility] Progress Note     Subjective:   Patient is sitting in chair this AM. Denies abdominal pain or nausea. Per husband she did well with CLD yesterday and did not complain of nausea and no emesis. She thinks she is passing some flatus. No BM.   Objective: Vital signs in last 24 hours: Temp:  [97.8 F (36.6 C)-98.3 F (36.8 C)] 98.1 F (36.7 C) (05/25 0500) Pulse Rate:  [73-77] 76 (05/25 0500) Resp:  [18] 18 (05/25 0500) BP: (137-152)/(57-73) 141/73 (05/25 0500) SpO2:  [98 %-99 %] 99 % (05/25 0500) Last BM Date: 09/18/19  Intake/Output from previous day: 05/24 0701 - 05/25 0700 In: 2675.7 [P.O.:600; I.V.:1946.1; IV Piggyback:129.6] Out: 2900 [Urine:2900] Intake/Output this shift: No intake/output data recorded.  PE: General: pleasant, WD, WN female who is laying in bed in NAD HEENT:  Sclera are noninjected.  PERRL.  Ears and nose without any masses or lesions.  Mouth is pink and moist Heart: regular, rate, and rhythm.  Normal s1,s2. No obvious murmurs, gallops, or rubs noted.  Palpable radial and pedal pulses bilaterally Lungs: CTAB, no wheezes, rhonchi, or rales noted.  Respiratory effort nonlabored Abd: soft, NT, ND, +BS, no masses, hernias, or organomegaly   Lab Results:  Recent Labs    09/19/19 0321 09/20/19 0343  WBC 10.1 6.6  HGB 10.4* 9.2*  HCT 33.0* 29.6*  PLT 185 156   BMET Recent Labs    09/19/19 0321 09/20/19 0343  NA 138 139  K 3.3* 3.4*  CL 104 107  CO2 27 25  GLUCOSE 121* 113*  BUN 14 10  CREATININE 0.77 0.66  CALCIUM 8.8* 8.8*   PT/INR No results for input(s): LABPROT, INR in the last 72 hours. CMP     Component Value Date/Time   NA 139 09/20/2019 0343   K 3.4 (L) 09/20/2019 0343   CL 107 09/20/2019 0343   CO2 25 09/20/2019 0343   GLUCOSE 113 (H) 09/20/2019 0343   BUN 10 09/20/2019 0343   CREATININE 0.66 09/20/2019 0343   CALCIUM 8.8 (L) 09/20/2019 0343   PROT 6.6 09/20/2019 0343   ALBUMIN 3.9 09/20/2019 0343   AST 29 09/20/2019 0343   ALT 46 (H) 09/20/2019 0343   ALKPHOS 55 09/20/2019 0343   BILITOT 0.6 09/20/2019 0343   GFRNONAA >60 09/20/2019 0343   GFRAA >60 09/20/2019 0343   Lipase     Component Value Date/Time   LIPASE 21 09/19/2019 0321       Studies/Results: CT ABDOMEN PELVIS W CONTRAST  Addendum Date: 09/18/2019   ADDENDUM REPORT: 09/18/2019 15:57 ADDENDUM: Such appearance could be caused by low grade, incomplete small bowel obstruction at the level of the distal ileum. Does the patient have known inflammatory bowel disease? Electronically Signed   By: Ted Mcalpine M.D.   On: 09/18/2019 15:57   Result Date: 09/18/2019 CLINICAL DATA:  Abdominal pain, nausea vomiting. EXAM: CT ABDOMEN AND PELVIS WITH CONTRAST TECHNIQUE: Multidetector CT imaging of the abdomen and pelvis was performed using the standard protocol following bolus administration of intravenous contrast. CONTRAST:  OMNIPAQUE IOHEXOL 300 MG/ML  SOLN COMPARISON:  Aug 31, 2019 FINDINGS: Lower chest: No acute abnormality. Hepatobiliary: No focal liver abnormality is seen. No gallstones, gallbladder wall thickening, or biliary dilatation. Pancreas: Unremarkable. No pancreatic ductal dilatation or surrounding inflammatory changes. Spleen: Normal in size without focal abnormality. Adrenals/Urinary Tract: Right-sided renal cyst, bilateral too small to be actually characterized few mm hypoattenuated renal lesions. No evidence of hydronephrosis  or nephrolithiasis. Normal urinary bladder. Stomach/Bowel: Normal appearance of the stomach and proximal small bowel. Fluid-filled moderately dilated distal ileal loops with symmetric mucosal thickening. Associated mesenteric stranding. Moderate to large pelvic ascites. A transitional point is identified in the lower central abdomen, best seen on the coronal view. The colon is decompressed. The maximum diameter of the abnormal small bowel loops is 2.6 cm, which is sub pathologic by CT  criteria. Vascular/Lymphatic: No significant vascular findings are present. No enlarged abdominal or pelvic lymph nodes. Reproductive: Status post hysterectomy. No adnexal masses. Other: No abdominal wall hernia or abnormality. No abdominopelvic ascites. Musculoskeletal: No acute or significant osseous findings. IMPRESSION: 1. Fluid-filled mildly dilated distal ileal loops with symmetric mucosal thickening, and associated mesenteric stranding and moderate to large pelvic ascites. A transitional point is identified in the lower central abdomen, best seen on the coronal view. The colon is decompressed. These findings may represent infectious or inflammatory enteritis. Electronically Signed: By: Fidela Salisbury M.D. On: 09/18/2019 14:25   DG Abd Portable 1V  Result Date: 09/19/2019 CLINICAL DATA:  Follow-up ileus EXAM: PORTABLE ABDOMEN - 1 VIEW COMPARISON:  09/18/2019 FINDINGS: Scattered large and small bowel gas is noted. Mildly prominent small bowel is noted although improved when compared with the prior exam. No free air is seen. No bony abnormality is noted. IMPRESSION: Mildly prominent small bowel although slightly improved when compared with the prior exam. Electronically Signed   By: Inez Catalina M.D.   On: 09/19/2019 12:23    Anti-infectives: Anti-infectives (From admission, onward)   Start     Dose/Rate Route Frequency Ordered Stop   09/18/19 1615  cefTRIAXone (ROCEPHIN) 1 g in sodium chloride 0.9 % 100 mL IVPB     1 g 200 mL/hr over 30 Minutes Intravenous  Once 09/18/19 1611 09/18/19 1739   09/18/19 1615  metroNIDAZOLE (FLAGYL) IVPB 500 mg     500 mg 100 mL/hr over 60 Minutes Intravenous  Once 09/18/19 1611 09/18/19 1852       Assessment/Plan HTN Pre-diabetes Brain stem AVM Dementia   Abdominal pain SBO vs enteritis with reactive ileus             - abdominal film yesterday showed some improvement             - abdominal exam remains completely benign             -  tolerating CLD - advance to FLD  - no indication for surgical intervention at this time, further advancement and care per GI and primary team. We will sign off at this time, please call if we can be of further assistance  FEN: FLD VTE: SCDs, lovenox IDL rocephin/flagyl 5/23  LOS: 2 days    Norm Parcel , [facility] Surgery 09/20/2019, 8:51 AM Please see Amion for pager number during day hours 7:00am-4:30pm  Agree with above.  Seen by Dr. Collene Mares for GI.  No surgical issues.  Alphonsa Overall, MD, [facility] Surgery Office phone:  [phone]

## 2019-09-20 NOTE — Progress Notes (Signed)
Dow Adolph, DO paged regarding the pt's Husband's request for a social work consult. Husband claims he needs assistance with the pt at home d/t dementia and is currently paying out of pocket for care. Does not want pt in a facility.

## 2019-09-21 DIAGNOSIS — R7989 Other specified abnormal findings of blood chemistry: Secondary | ICD-10-CM

## 2019-09-21 DIAGNOSIS — K529 Noninfective gastroenteritis and colitis, unspecified: Secondary | ICD-10-CM

## 2019-09-21 DIAGNOSIS — D72829 Elevated white blood cell count, unspecified: Secondary | ICD-10-CM

## 2019-09-21 LAB — COMPREHENSIVE METABOLIC PANEL
ALT: 43 U/L (ref 0–44)
AST: 30 U/L (ref 15–41)
Albumin: 4.2 g/dL (ref 3.5–5.0)
Alkaline Phosphatase: 60 U/L (ref 38–126)
Anion gap: 8 (ref 5–15)
BUN: 6 mg/dL — ABNORMAL LOW (ref 8–23)
CO2: 24 mmol/L (ref 22–32)
Calcium: 9.1 mg/dL (ref 8.9–10.3)
Chloride: 107 mmol/L (ref 98–111)
Creatinine, Ser: 0.76 mg/dL (ref 0.44–1.00)
GFR calc Af Amer: >60 mL/min (ref >60)
GFR calc non Af Amer: >60 mL/min (ref >60)
Glucose, Bld: 104 mg/dL — ABNORMAL HIGH (ref 70–99)
Potassium: 4.1 mmol/L (ref 3.5–5.1)
Sodium: 139 mmol/L (ref 135–145)
Total Bilirubin: 0.7 mg/dL (ref 0.3–1.2)
Total Protein: 6.9 g/dL (ref 6.5–8.1)

## 2019-09-21 LAB — CBC WITH DIFFERENTIAL/PLATELET
Abs Immature Granulocytes: 0.02 10*3/uL (ref 0.00–0.07)
Basophils Absolute: 0 10*3/uL (ref 0.0–0.1)
Basophils Relative: 1 %
Eosinophils Absolute: 0.2 10*3/uL (ref 0.0–0.5)
Eosinophils Relative: 3 %
HCT: 31.4 % — ABNORMAL LOW (ref 36.0–46.0)
Hemoglobin: 9.8 g/dL — ABNORMAL LOW (ref 12.0–15.0)
Immature Granulocytes: 0 %
Lymphocytes Relative: 30 %
Lymphs Abs: 1.9 10*3/uL (ref 0.7–4.0)
MCH: 27.8 pg (ref 26.0–34.0)
MCHC: 31.2 g/dL (ref 30.0–36.0)
MCV: 89 fL (ref 80.0–100.0)
Monocytes Absolute: 0.4 10*3/uL (ref 0.1–1.0)
Monocytes Relative: 7 %
Neutro Abs: 3.7 10*3/uL (ref 1.7–7.7)
Neutrophils Relative %: 59 %
Platelets: 170 10*3/uL (ref 150–400)
RBC: 3.53 MIL/uL — ABNORMAL LOW (ref 3.87–5.11)
RDW: 15.4 % (ref 11.5–15.5)
WBC: 6.3 10*3/uL (ref 4.0–10.5)
nRBC: 0 % (ref 0.0–0.2)

## 2019-09-21 MED ORDER — SENNOSIDES-DOCUSATE SODIUM 8.6-50 MG PO TABS: 1.0000 | ORAL_TABLET | Freq: Every evening | ORAL | 1 refills | Status: AC | PRN

## 2019-09-21 NOTE — Plan of Care (Signed)
  Problem: Health Behavior/Discharge Planning: Goal: Ability to manage health-related needs will improve Outcome: Progressing   Problem: Clinical Measurements: Goal: Ability to maintain clinical measurements within normal limits will improve Outcome: Progressing Goal: Will remain free from infection Outcome: Progressing Goal: Diagnostic test results will improve Outcome: Progressing   

## 2019-09-21 NOTE — Care Management Important Message (Signed)
Important Message  Patient Details IM Letter given to [name] SW Case Manager to present to the Patient. Name: Melanie Kaiser MRN: [id number] Date of Birth: 07-31-1950   Medicare Important Message Given:  Yes     Caren Macadam 09/21/2019, 11:34 AM

## 2019-09-21 NOTE — Discharge Summary (Signed)
Melanie Kaiser AOZ:[id number] DOB: [birth year] DOA: 09/18/2019  PCP: Gweneth Dimitri, MD  Admit date: 09/18/2019 Discharge date: 09/21/2019  Admitted From: home Disposition:  home  Recommendations for Outpatient Follow-up:  1. Follow up with PCP in 1-2 weeks 2. Provided script for PRN senna-docusate 3. Please obtain BMP/CBC in one week 4. Please follow up on the following pending results:  Home Health:none  Equipment/Devices:none  Discharge Condition:Stable  CODE STATUS:FULL   Brief/Interim Summary: History of present illness:  Melanie Kaiser is a 69 y.o. year old female with medical history significant for dementia, hypertension, prediabetes, and admission in March with possible low-grade SBO who presented on 09/18/2019 with one day of abdominal pian, nausea, 2 episodes of non-bloody emesis. Lab work up was notable for leukocytosis and CT abdominal scan concerning for thickening of the distal small bowel with fluid in the pelvis and possible partial small bowel obstruction. Patient was recently admitted in March of 2021 for similar presentation that resolved medically with NG tube and was found to have partial SBO. Remaining hospital course addressed in problem based format below:   Hospital Course:   Abdominal Pain presumed secondary to Partial SBO versus enteritis with reactive ileus.  Patient was treated managed medically with IV fluids and bowel rest. Surgery was consulted given concern for partial SBO and given stability in her abdominal exam with no recurrent pain advanced her diet to clears in 24 hours of her admission. She did not require NG tube as she did not have recurrent emesis. GI was also consulted given concern for inflammation fo dilated loops  Versus infection in the small bowel. They recommended CT enterography for further evaluation. Unfortunately given patient's poor baseline mental status she was unable to follow th contrast protocol (drinking 1.5 L of fluid)  instructions required to allow for the procedure so no further imaging was obtained. On day of discharge all abdominal pain had resolved, patient was tolerating her regular diet with no issues. GI and surgery consultants agreed in her clinical improvement and recommended no further interventions.  A script for senna docusate was provided on discharge for use PRN.  Leukocytosis, resolved. 13.9 on admission. Suspect likely reactive in setting of partial SBO. No other localizing signs or symptoms of infection. She received a one time dose of IV rocephin in the ED but that was not continued on admission due to concern for unlikely infection. She remained afebrile with no further leukocytosis. WBC was 6.3 on discharge  Elevated LFTS. Transient in nature and resolved on discharge. Slightly elevated to AST 67 and ALT 74 on admission that resolved to 30 and 43 respectively. Very likely reactive from partial SBO given resolution with supportive care and no elevated alkaline phosphatase, bilirubin.   HTN stable, Continue home Toprol-XL   Dementia, with behavioral disturbances. She had some confusion initially on admission but with reorientation she remained stable onher home regimen of seroquel. Patient at baseline is pleasantly confused alert to self only.     Consultations:  GI, Surgery  Procedures/Studies: none Subjective: Feeling well Discharge Exam: Vitals:   09/21/19 0650 09/21/19 0957  BP: 138/78 138/73  Pulse: 69 90  Resp: 20 16  Temp: 97.9 F (36.6 C) 98.7 F (37.1 C)  SpO2: 98% 98%   Vitals:   09/20/19 1716 09/20/19 2028 09/21/19 0650 09/21/19 0957  BP: (!) 155/72 137/79 138/78 138/73  Pulse: (!) 101 74 69 90  Resp:  18 20 16   Temp:  98.6 F (37 C) 97.9 F (  36.6 C) 98.7 F (37.1 C)  TempSrc:  Oral Oral   SpO2:  99% 98% 98%  Weight:      Height:        General: Lying in bed, no apparent distress Eyes: EOMI, anicteric ENT: Oral Mucosa clear and moist Cardiovascular:  regular rate and rhythm, no murmurs, rubs or gallops, no edema, Respiratory: Normal respiratory effort on room air, lungs clear to auscultation bilaterally Abdomen: soft, non-distended, non-tender, normal bowel sounds Skin: No Rash Neurologic: oleasantly confused, alert to self only, able to follow commands Psychiatric:Appropriate affect, and mood  Discharge Diagnoses:  Principal Problem:   Small bowel obstruction, partial (HCC) Active Problems:   Benign essential hypertension   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   LORazepam 0.5 MG tablet Commonly known as: ATIVAN Take 0.25 mg by mouth daily.   metoprolol succinate 25 MG 24 hr tablet Commonly known as: TOPROL-XL Take 25 mg by mouth daily.   OVER THE COUNTER MEDICATION Take 1 tablet by mouth daily. Mega Red   PRESERVISION AREDS PO Take 1 tablet by mouth daily.   propranolol 40 MG tablet Commonly known as: INDERAL Take 40 mg by mouth 2 (two) times daily.   QUEtiapine 50 MG tablet Commonly known as: SEROQUEL Take 50-100 mg by mouth See admin instructions. Takes 50 mg by mouth in the morning and 100 mg at night.   senna-docusate 8.6-50 MG tablet Commonly known as: Senokot-S Take 1 tablet by mouth at bedtime as needed for mild constipation.   sertraline 25 MG tablet Commonly known as: ZOLOFT Take 25 mg by mouth daily.   triamterene-hydrochlorothiazide 37.5-25 MG tablet Commonly known as: MAXZIDE-25 TAKE ONE TABLET BY MOUTH EVERY DAY   Vitamin B  Complex Tabs Take 1 tablet by mouth daily.   Vitamin D-3 25 MCG (1000 UT) Caps Take 1,000 Units by mouth daily.   vitamin E 1000 UNIT capsule Take 1,000 Units by mouth daily.       No Known Allergies      The results of significant diagnostics from this hospitalization (including imaging, microbiology, ancillary and laboratory) are listed below for reference.     Microbiology: Recent Results (from the past 240 hour(s))  Culture, blood (Routine X 2) w Reflex to ID Panel     Status: None (Preliminary result)   Collection Time: 09/18/19  4:28 PM   Specimen: BLOOD  Result Value Ref Range Status   Specimen Description   Final    BLOOD RIGHT ANTECUBITAL Performed at [facility], [address] [place], [place] [address]    Special Requests   Final    BOTTLES DRAWN AEROBIC ONLY Blood Culture results may not be optimal due to an inadequate volume of blood received in culture bottles Performed at [facility], [address] [place], [place] [address]    Culture   Final    NO GROWTH 2 DAYS Performed at  [facility], [address] [place], [place] [address]    Report Status PENDING  Incomplete  Urine Culture     Status: None   Collection Time: 09/18/19  4:54 PM   Specimen: Urine, Random  Result Value Ref Range Status   Specimen Description   Final    URINE, RANDOM Performed at [facility] [place], [address] [place], [place] [address]    Special Requests   Final    NONE Performed at [facility] [place], [address] [place], [place] [address]    Culture   Final    NO GROWTH Performed at [facility], [address] [place], [place] [address]    Report Status 09/20/2019 FINAL  Final  SARS Coronavirus 2 by RT PCR (hospital order, performed in [facility] lab) Nasopharyngeal Urine, Catheterized     Status: None   Collection Time: 09/18/19  4:55 PM   Specimen: Urine, Catheterized; Nasopharyngeal  Result  Value Ref Range Status   SARS Coronavirus 2 NEGATIVE NEGATIVE Final    Comment: (NOTE) SARS-CoV-2 target nucleic acids are NOT DETECTED. The SARS-CoV-2 RNA is generally detectable in upper and lower respiratory specimens during the acute phase of infection. The lowest concentration of SARS-CoV-2 viral copies this assay can detect is 250 copies / mL. A negative result does not preclude SARS-CoV-2 infection and should not be used as the sole basis for treatment or other patient management decisions.  A negative result may occur with improper specimen collection / handling, submission of specimen other than nasopharyngeal swab, presence of viral mutation(s) within the areas targeted by this assay, and inadequate number of viral copies (<250 copies / mL). A negative result must be combined with clinical observations, patient history, and epidemiological information. Fact Sheet for Patients:   BoilerBrush.com.cyhttps://www.fda.gov/media/136312/download Fact Sheet for Healthcare Providers: https://pope.com/https://www.fda.gov/media/136313/download This test is not yet approved or cleared  by the [place] FDA and has been authorized for detection and/or diagnosis of SARS-CoV-2 by FDA under an Emergency Use Authorization (EUA).  This EUA will remain in effect (meaning this test can be used) for the duration of the COVID-19 declaration under Section 564(b)(1) of the Act, 21 U.S.C. section 360bbb-3(b)(1), unless the authorization is terminated or revoked sooner. Performed at [facility] [place], [address] [place], [place] [address]   Culture, blood (Routine X 2) w Reflex to ID Panel     Status: None (Preliminary result)   Collection Time: 09/18/19  5:12 PM   Specimen: BLOOD  Result Value Ref Range Status   Specimen Description   Final    BLOOD BLOOD LEFT ARM Performed at [facility] [place], [address] [place], [place] [address]    Special Requests   Final    BOTTLES DRAWN AEROBIC ONLY Blood Culture  results may not be optimal due to an inadequate volume of blood received in culture bottles Performed at [facility] [place], [address] [place], [place] [address]    Culture   Final    NO GROWTH 2 DAYS Performed at [facility], [address] [place], [place] [address]    Report Status PENDING  Incomplete     Labs: BNP (last 3 results) No results for input(s): BNP in the last 8760 hours. Basic Metabolic Panel: Recent Labs  Lab 09/18/19 1239 09/19/19 0321 09/20/19 0343 09/21/19 0808  NA 137 138 139 139  K 4.7 3.3*  3.4* 4.1  CL 98 104 107 107  CO2 25 27 25 24   GLUCOSE 127* 121* 113* 104*  BUN 16 14 10  6*  CREATININE 0.86 0.77 0.66 0.76  CALCIUM 9.7 8.8* 8.8* 9.1  MG  --  1.8  --   --   PHOS  --  3.2  --   --    Liver Function Tests: Recent Labs  Lab 09/18/19 1239 09/19/19 0321 09/20/19 0343 09/21/19 0808  AST 67* 33 29 30  ALT 74* 53* 46* 43  ALKPHOS 78 59 55 60  BILITOT 1.2 0.7 0.6 0.7  PROT 8.9* 6.7 6.6 6.9  ALBUMIN 5.1* 3.9 3.9 4.2   Recent Labs  Lab 09/18/19 1239 09/19/19 0321  LIPASE 63* 21   No results for input(s): AMMONIA in the last 168 hours. CBC: Recent Labs  Lab 09/18/19 1239 09/19/19 0321 09/20/19 0343 09/21/19 0808  WBC 13.9* 10.1 6.6 6.3  NEUTROABS 12.2* 7.2 3.8 3.7  HGB 12.7 10.4* 9.2* 9.8*  HCT 39.6 33.0* 29.6* 31.4*  MCV 86.3 88.2 88.6 89.0  PLT 209 185 156 170   Cardiac Enzymes: No results for input(s): CKTOTAL, CKMB, CKMBINDEX, TROPONINI in the last 168 hours. BNP: Invalid input(s): POCBNP CBG: Recent Labs  Lab 09/19/19 2023 09/20/19 0007 09/20/19 0421 09/20/19 0734 09/20/19 1238  GLUCAP 129* 174* 103* 118* 122*   D-Dimer No results for input(s): DDIMER in the last 72 hours. Hgb A1c Recent Labs    09/19/19 0321  HGBA1C 6.2*   Lipid Profile No results for input(s): CHOL, HDL, LDLCALC, TRIG, CHOLHDL, LDLDIRECT in the last 72 hours. Thyroid function studies No results for input(s): TSH,  T4TOTAL, T3FREE, THYROIDAB in the last 72 hours.  Invalid input(s): FREET3 Anemia work up No results for input(s): VITAMINB12, FOLATE, FERRITIN, TIBC, IRON, RETICCTPCT in the last 72 hours. Urinalysis    Component Value Date/Time   COLORURINE YELLOW 09/18/2019 1654   APPEARANCEUR CLEAR 09/18/2019 1654   LABSPEC <1.005 (L) 09/18/2019 1654   PHURINE 6.0 09/18/2019 1654   GLUCOSEU NEGATIVE 09/18/2019 1654   HGBUR NEGATIVE 09/18/2019 1654   BILIRUBINUR NEGATIVE 09/18/2019 1654   KETONESUR NEGATIVE 09/18/2019 1654   PROTEINUR NEGATIVE 09/18/2019 1654   NITRITE NEGATIVE 09/18/2019 [place] 09/18/2019 1654   Sepsis Labs Invalid input(s): PROCALCITONIN,  WBC,  LACTICIDVEN Microbiology Recent Results (from the past 240 hour(s))  Culture, blood (Routine X 2) w Reflex to ID Panel     Status: None (Preliminary result)   Collection Time: 09/18/19  4:28 PM   Specimen: BLOOD  Result Value Ref Range Status   Specimen Description   Final    BLOOD RIGHT ANTECUBITAL Performed at [facility], [address] [place], [place] [address]    Special Requests   Final    BOTTLES DRAWN AEROBIC ONLY Blood Culture results may not be optimal due to an inadequate volume of blood received in culture bottles Performed at [facility], [address] [place], [place] [address]    Culture   Final    NO GROWTH 2 DAYS Performed at [facility], [place] [address], [place], [place] [address]    Report Status PENDING  Incomplete  Urine Culture     Status: None   Collection Time: 09/18/19  4:54 PM   Specimen: Urine, Random  Result Value Ref Range Status   Specimen Description   Final    URINE, RANDOM Performed at [facility]  [address], [place], [place] [address]    Special Requests   Final    NONE Performed at [facility], [address] [place], [place] [address]    Culture   Final    NO GROWTH Performed at [facility], [address], [place], [place] [address]    Report Status 09/20/2019 FINAL  Final  SARS Coronavirus 2 by RT PCR (hospital order, performed in [facility] hospital lab) Nasopharyngeal Urine, Catheterized     Status: None   Collection Time: 09/18/19  4:55 PM   Specimen: Urine, Catheterized; Nasopharyngeal  Result Value Ref Range Status   SARS Coronavirus 2 NEGATIVE NEGATIVE Final    Comment: (NOTE) SARS-CoV-2 target nucleic acids are NOT DETECTED. The SARS-CoV-2 RNA is generally detectable in upper and lower respiratory specimens during the acute phase of infection. The lowest concentration of SARS-CoV-2 viral copies this assay can detect is 250 copies / mL. A negative result does not preclude SARS-CoV-2 infection and should not be used as the sole basis for treatment or other patient management decisions.  A negative result may occur with improper specimen collection / handling, submission of specimen other than nasopharyngeal swab, presence of viral mutation(s) within the areas targeted by this assay, and inadequate number of viral copies (<250 copies / mL). A negative result must be combined with clinical observations, patient history, and epidemiological information. Fact Sheet for Patients:   BoilerBrush.com.cy Fact Sheet for Healthcare Providers: https://pope.com/ This test is not yet approved or cleared  by the [place] FDA and has been authorized for detection and/or diagnosis of SARS-CoV-2 by FDA under an Emergency Use Authorization (EUA).  This EUA will remain in effect (meaning this test can be used) for the duration of the COVID-19 declaration under Section 564(b)(1) of the Act, 21 U.S.C. section 360bbb-3(b)(1), unless the authorization is terminated or revoked sooner. Performed at [facility], [address] [place], [place] [address]   Culture, blood (Routine X 2) w Reflex to ID Panel      Status: None (Preliminary result)   Collection Time: 09/18/19  5:12 PM   Specimen: BLOOD  Result Value Ref Range Status   Specimen Description   Final    BLOOD BLOOD LEFT ARM Performed at [facility], [address] [place], [place] [address]    Special Requests   Final    BOTTLES DRAWN AEROBIC ONLY Blood Culture results may not be optimal due to an inadequate volume of blood received in culture bottles Performed at [facility], [address] [place], [place] [address]    Culture   Final    NO GROWTH 2 DAYS Performed at [facility], [address], [place], [place] [address]    Report Status PENDING  Incomplete     Time coordinating discharge: Over 30 minutes  SIGNED:   Laverna Peace, MD  [facility] 09/21/2019, 12:23 PM Pager   If 7PM-7AM, please contact night-coverage [email or web address] Password [email or web address]

## 2019-09-21 NOTE — Progress Notes (Signed)
Patient had one small formed bowel movement this shift.

## 2019-09-21 NOTE — Plan of Care (Signed)

## 2019-09-21 NOTE — Plan of Care (Signed)
  Problem: Education: Goal: Knowledge of General Education information will improve Description: Including pain rating scale, medication(s)/side effects and non-pharmacologic comfort measures 09/21/2019 1334 by Iantha Fallen, RN Outcome: Adequate for Discharge 09/21/2019 1110 by Iantha Fallen, RN Outcome: Progressing   Problem: Health Behavior/Discharge Planning: Goal: Ability to manage health-related needs will improve 09/21/2019 1334 by Iantha Fallen, RN Outcome: Adequate for Discharge 09/21/2019 1110 by Iantha Fallen, RN Outcome: Progressing   Problem: Clinical Measurements: Goal: Ability to maintain clinical measurements within normal limits will improve 09/21/2019 1334 by Iantha Fallen, RN Outcome: Adequate for Discharge 09/21/2019 1110 by Iantha Fallen, RN Outcome: Progressing Goal: Will remain free from infection 09/21/2019 1334 by Iantha Fallen, RN Outcome: Adequate for Discharge 09/21/2019 1110 by Iantha Fallen, RN Outcome: Progressing Goal: Diagnostic test results will improve 09/21/2019 1334 by Iantha Fallen, RN Outcome: Adequate for Discharge 09/21/2019 1110 by Iantha Fallen, RN Outcome: Progressing Goal: Cardiovascular complication will be avoided Outcome: Adequate for Discharge   Problem: Activity: Goal: Risk for activity intolerance will decrease Outcome: Adequate for Discharge   Problem: Nutrition: Goal: Adequate nutrition will be maintained Outcome: Adequate for Discharge   Problem: Coping: Goal: Level of anxiety will decrease Outcome: Adequate for Discharge   Problem: Elimination: Goal: Will not experience complications related to bowel motility Outcome: Adequate for Discharge Goal: Will not experience complications related to urinary retention Outcome: Adequate for Discharge   Problem: Pain Managment: Goal: General experience of comfort will improve Outcome: Adequate for Discharge   Problem: Safety: Goal: Ability to remain free  from injury will improve Outcome: Adequate for Discharge   Problem: Skin Integrity: Goal: Risk for impaired skin integrity will decrease Outcome: Adequate for Discharge

## 2019-09-23 LAB — CULTURE, BLOOD (ROUTINE X 2)
Culture: NO GROWTH
Culture: NO GROWTH

## 2019-09-29 DIAGNOSIS — R7989 Other specified abnormal findings of blood chemistry: Secondary | ICD-10-CM | POA: Diagnosis present

## 2019-09-29 DIAGNOSIS — D72829 Elevated white blood cell count, unspecified: Secondary | ICD-10-CM | POA: Diagnosis present

## 2020-02-02 DIAGNOSIS — I10 Essential (primary) hypertension: Secondary | ICD-10-CM | POA: Diagnosis not present

## 2020-02-02 DIAGNOSIS — G309 Alzheimer's disease, unspecified: Secondary | ICD-10-CM | POA: Diagnosis not present

## 2020-02-02 DIAGNOSIS — F0390 Unspecified dementia without behavioral disturbance: Secondary | ICD-10-CM | POA: Diagnosis not present

## 2020-02-02 DIAGNOSIS — F0150 Vascular dementia without behavioral disturbance: Secondary | ICD-10-CM | POA: Diagnosis not present

## 2020-02-02 DIAGNOSIS — G2571 Drug induced akathisia: Secondary | ICD-10-CM | POA: Diagnosis not present

## 2020-02-15 ENCOUNTER — Emergency Department (HOSPITAL_BASED_OUTPATIENT_CLINIC_OR_DEPARTMENT_OTHER)
Admission: EM | Admit: 2020-02-15 | Discharge: 2020-02-15 | Disposition: A | Discharge status: Home / Self Care | Payer: Medicare PPO | Attending: Emergency Medicine | Admitting: Emergency Medicine

## 2020-02-15 ENCOUNTER — Encounter (HOSPITAL_BASED_OUTPATIENT_CLINIC_OR_DEPARTMENT_OTHER): Payer: Self-pay | Admitting: *Deleted

## 2020-02-15 ENCOUNTER — Other Ambulatory Visit: Payer: Self-pay

## 2020-02-15 DIAGNOSIS — F0390 Unspecified dementia without behavioral disturbance: Secondary | ICD-10-CM | POA: Insufficient documentation

## 2020-02-15 DIAGNOSIS — M5459 Other low back pain: Secondary | ICD-10-CM | POA: Diagnosis not present

## 2020-02-15 DIAGNOSIS — I10 Essential (primary) hypertension: Secondary | ICD-10-CM | POA: Diagnosis not present

## 2020-02-15 DIAGNOSIS — E119 Type 2 diabetes mellitus without complications: Secondary | ICD-10-CM | POA: Diagnosis not present

## 2020-02-15 DIAGNOSIS — M545 Low back pain, unspecified: Secondary | ICD-10-CM

## 2020-02-15 MED ORDER — LIDOCAINE 5 % EX PTCH
1.0000 | MEDICATED_PATCH | CUTANEOUS | 0 refills | Status: AC
Start: 2020-02-15 — End: ?

## 2020-02-15 NOTE — ED Triage Notes (Signed)
Family member states c/o lower right back pain x 5 days , unknown if fall , HX dementia

## 2020-02-15 NOTE — ED Provider Notes (Signed)
[facility] Emergency Department Provider Note MRN:  [id number]  Arrival date & time: 02/15/20     Chief Complaint   Back Pain   History of Present Illness   Melanie Kaiser is a 69 y.o. year-old female with a history of dementia, diabetes, hypertension presenting to the ED with chief complaint of back pain.  5 days of right-sided low back pain, patient has significant dementia and at times endorses a recent fall but at times denies recent fall.  No bowel or bladder dysfunction, patient denying numbness or weakness, no pain anywhere else.  I was unable to obtain an accurate HPI, PMH, or ROS due to the patient's dementia.  Level 5 caveat.  Review of Systems  Positive for back pain.  Patient's Health History    Past Medical History:  Diagnosis Date  . Allergic rhinitis allergic   allergic rhinitis  . 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 Depression   . Diabetes mellitus   . Hypertension     Past Surgical History:  Procedure Laterality Date  . ABDOMINAL HYSTERECTOMY     vaginal  . BRAIN AVM REPAIR      Family History  Problem Relation Age of Onset  . Mental illness Father   . Stroke Father     Social History   Socioeconomic History  . Marital status: Married    Spouse name: Not on file  . Number of children: Not on file  . Years of education: Not on file  . Highest education level: Not on file  Occupational History  . Not on file  Tobacco Use  . Smoking status: Never Smoker  . Smokeless tobacco: Never Used  Substance and Sexual Activity  . Alcohol use: No  . Drug use: Never  . Sexual activity: Yes    Birth control/protection: None  Other Topics Concern  . Not on file  Social History Narrative  . Not on file   Social Determinants of Health   Financial Resource Strain:   . Difficulty of Paying Living Expenses: Not on file  Food Insecurity:   . Worried About Programme researcher, broadcasting/film/video in the Last Year:  Not on file  . Ran Out of Food in the Last Year: Not on file  Transportation Needs:   . Lack of Transportation (Medical): Not on file  . Lack of Transportation (Non-Medical): Not on file  Physical Activity:   . Days of Exercise per Week: Not on file  . Minutes of Exercise per Session: Not on file  Stress:   . Feeling of Stress : Not on file  Social Connections:   . Frequency of Communication with Friends and Family: Not on file  . Frequency of Social Gatherings with Friends and Family: Not on file  . Attends Religious Services: Not on file  . Active Member of Clubs or Organizations: Not on file  . Attends Banker Meetings: Not on file  . Marital Status: Not on file  Intimate Partner Violence:   . Fear of Current or Ex-Partner: Not on file  . Emotionally Abused: Not on file  . Physically Abused: Not on file  . Sexually Abused: Not on file     Physical Exam   Vitals:   02/15/20 1504  BP: 130/74  Pulse: 75  Resp: 18  Temp: 99 F (37.2 C)  SpO2: 98%    CONSTITUTIONAL: Well-appearing, NAD NEURO:  Alert but not oriented, normal and symmetric strength  and sensation, normal coordination, normal speech EYES:  eyes equal and reactive ENT/NECK:  no LAD, no JVD CARDIO: Regular rate, well-perfused, normal S1 and S2 PULM:  CTAB no wheezing or rhonchi GI/GU:  normal bowel sounds, non-distended, non-tender MSK/SPINE:  No gross deformities, no edema SKIN:  no rash, atraumatic PSYCH:  Appropriate speech and behavior  *Additional and/or pertinent findings included in MDM below  Diagnostic and Interventional Summary    EKG Interpretation  Date/Time:    Ventricular Rate:    PR Interval:    QRS Duration:   QT Interval:    QTC Calculation:   R Axis:     Text Interpretation:        Labs Reviewed - No data to display  No orders to display    Medications - No data to display   Procedures  /  Critical Care Procedures  ED Course and Medical Decision Making  I  have reviewed the triage vital signs, the nursing notes, and pertinent available records from the EMR.  Listed above are laboratory and imaging tests that I personally ordered, reviewed, and interpreted and then considered in my medical decision making (see below for details).  No midline spinal tenderness, really no tenderness of any kind on my exam, no CVA tenderness, no muscular tenderness to the lower back but daughter does point to a focal spot in the right lumbar region that she was tender at yesterday.  Normal strength and sensation to the arms and legs, no reported bowel or bladder dysfunction, no red flag symptoms to suggest myelopathy, no dysuria, recent urinalysis at outside hospital was normal a few days ago.  Appropriate for discharge.       Elmer Sow. Pilar Plate, MD [facility] Emergency Medicine [facility] [email or web address]  Final Clinical Impressions(s) / ED Diagnoses     ICD-10-CM   1. Acute right-sided low back pain without sciatica  M54.50     ED Discharge Orders         Ordered    lidocaine (LIDODERM) 5 %  Every 24 hours        02/15/20 1514           Discharge Instructions Discussed with and Provided to Patient:     Discharge Instructions     You were evaluated in the Emergency Department and after careful evaluation, we did not find any emergent condition requiring admission or further testing in the hospital.  Your exam/testing today was overall reassuring.  Symptoms seem to be due to muscle strain or sprain.  Please continue Tylenol 1000 mg every 4-6 hours.  We also recommend the Lidoderm numbing patches to be used as directed.  Light activity, stretching, and warm compresses are also encouraged.  Please return to the Emergency Department if you experience any worsening of your condition.  Thank you for allowing [place] to be a part of your care.       Sabas Sous, MD 02/15/20 [phone]

## 2020-02-15 NOTE — Discharge Instructions (Addendum)
You were evaluated in the Emergency Department and after careful evaluation, we did not find any emergent condition requiring admission or further testing in the hospital.  Your exam/testing today was overall reassuring.  Symptoms seem to be due to muscle strain or sprain.  Please continue Tylenol 1000 mg every 4-6 hours.  We also recommend the Lidoderm numbing patches to be used as directed.  Light activity, stretching, and warm compresses are also encouraged.  Please return to the Emergency Department if you experience any worsening of your condition.  Thank you for allowing [place] to be a part of your care.

## 2020-03-12 DIAGNOSIS — Z1231 Encounter for screening mammogram for malignant neoplasm of breast: Secondary | ICD-10-CM | POA: Diagnosis not present

## 2020-04-02 DIAGNOSIS — Z111 Encounter for screening for respiratory tuberculosis: Secondary | ICD-10-CM | POA: Diagnosis not present

## 2020-04-03 DIAGNOSIS — Z20828 Contact with and (suspected) exposure to other viral communicable diseases: Secondary | ICD-10-CM | POA: Diagnosis not present

## 2020-04-03 DIAGNOSIS — Z20822 Contact with and (suspected) exposure to covid-19: Secondary | ICD-10-CM | POA: Diagnosis not present

## 2020-04-23 DIAGNOSIS — K5901 Slow transit constipation: Secondary | ICD-10-CM | POA: Diagnosis not present

## 2020-04-23 DIAGNOSIS — I159 Secondary hypertension, unspecified: Secondary | ICD-10-CM | POA: Diagnosis not present

## 2020-04-23 DIAGNOSIS — F418 Other specified anxiety disorders: Secondary | ICD-10-CM | POA: Diagnosis not present

## 2020-04-23 DIAGNOSIS — E559 Vitamin D deficiency, unspecified: Secondary | ICD-10-CM | POA: Diagnosis not present

## 2020-04-23 DIAGNOSIS — E785 Hyperlipidemia, unspecified: Secondary | ICD-10-CM | POA: Diagnosis not present

## 2020-04-23 DIAGNOSIS — K449 Diaphragmatic hernia without obstruction or gangrene: Secondary | ICD-10-CM | POA: Diagnosis not present

## 2020-04-23 DIAGNOSIS — F0281 Dementia in other diseases classified elsewhere with behavioral disturbance: Secondary | ICD-10-CM | POA: Diagnosis not present

## 2020-04-23 DIAGNOSIS — G309 Alzheimer's disease, unspecified: Secondary | ICD-10-CM | POA: Diagnosis not present

## 2020-05-01 DIAGNOSIS — F0390 Unspecified dementia without behavioral disturbance: Secondary | ICD-10-CM | POA: Diagnosis not present

## 2020-05-01 DIAGNOSIS — G2571 Drug induced akathisia: Secondary | ICD-10-CM | POA: Diagnosis not present

## 2020-05-01 DIAGNOSIS — I10 Essential (primary) hypertension: Secondary | ICD-10-CM | POA: Diagnosis not present

## 2020-05-09 DIAGNOSIS — G309 Alzheimer's disease, unspecified: Secondary | ICD-10-CM | POA: Diagnosis not present

## 2020-05-20 DIAGNOSIS — N949 Unspecified condition associated with female genital organs and menstrual cycle: Secondary | ICD-10-CM | POA: Diagnosis not present

## 2020-05-20 DIAGNOSIS — N760 Acute vaginitis: Secondary | ICD-10-CM | POA: Diagnosis not present

## 2020-05-20 DIAGNOSIS — A630 Anogenital (venereal) warts: Secondary | ICD-10-CM | POA: Diagnosis not present

## 2020-05-23 DIAGNOSIS — B373 Candidiasis of vulva and vagina: Secondary | ICD-10-CM | POA: Diagnosis not present

## 2020-05-23 DIAGNOSIS — L723 Sebaceous cyst: Secondary | ICD-10-CM | POA: Diagnosis not present

## 2020-05-24 DIAGNOSIS — I10 Essential (primary) hypertension: Secondary | ICD-10-CM | POA: Diagnosis not present

## 2020-05-24 DIAGNOSIS — F0390 Unspecified dementia without behavioral disturbance: Secondary | ICD-10-CM | POA: Diagnosis not present

## 2020-05-29 DIAGNOSIS — K5901 Slow transit constipation: Secondary | ICD-10-CM | POA: Diagnosis not present

## 2020-05-29 DIAGNOSIS — F418 Other specified anxiety disorders: Secondary | ICD-10-CM | POA: Diagnosis not present

## 2020-06-06 DIAGNOSIS — Z20828 Contact with and (suspected) exposure to other viral communicable diseases: Secondary | ICD-10-CM | POA: Diagnosis not present

## 2020-06-07 DIAGNOSIS — G4700 Insomnia, unspecified: Secondary | ICD-10-CM | POA: Diagnosis not present

## 2020-06-07 DIAGNOSIS — F0281 Dementia in other diseases classified elsewhere with behavioral disturbance: Secondary | ICD-10-CM | POA: Diagnosis not present

## 2020-06-07 DIAGNOSIS — K5901 Slow transit constipation: Secondary | ICD-10-CM | POA: Diagnosis not present

## 2020-06-07 DIAGNOSIS — F418 Other specified anxiety disorders: Secondary | ICD-10-CM | POA: Diagnosis not present

## 2020-06-07 DIAGNOSIS — E559 Vitamin D deficiency, unspecified: Secondary | ICD-10-CM | POA: Diagnosis not present

## 2020-06-07 DIAGNOSIS — E785 Hyperlipidemia, unspecified: Secondary | ICD-10-CM | POA: Diagnosis not present

## 2020-06-07 DIAGNOSIS — I159 Secondary hypertension, unspecified: Secondary | ICD-10-CM | POA: Diagnosis not present

## 2020-06-07 DIAGNOSIS — G309 Alzheimer's disease, unspecified: Secondary | ICD-10-CM | POA: Diagnosis not present

## 2020-06-07 DIAGNOSIS — K449 Diaphragmatic hernia without obstruction or gangrene: Secondary | ICD-10-CM | POA: Diagnosis not present

## 2020-06-11 IMAGING — CT CT ABD-PELV W/ CM
2 of 5 series · 16 of 46 positions shown, 18 images · IV contrast (Omnipaque)
Comparison: None

CLINICAL DATA: Abdominal pain.

EXAM:
CT ABDOMEN AND PELVIS WITH CONTRAST
TECHNIQUE: Multidetector CT imaging of the abdomen and pelvis was performed
using the standard protocol following bolus administration of
intravenous contrast.
CONTRAST:  100mL OMNIPAQUE IOHEXOL 300 MG/ML  SOLN

[Series 2: axial st · axial · 0.87mm/px · z∈[-370,+50]mm · 13 of 94 slices shown, 15 images]
[im 5/94  soft-tissue]
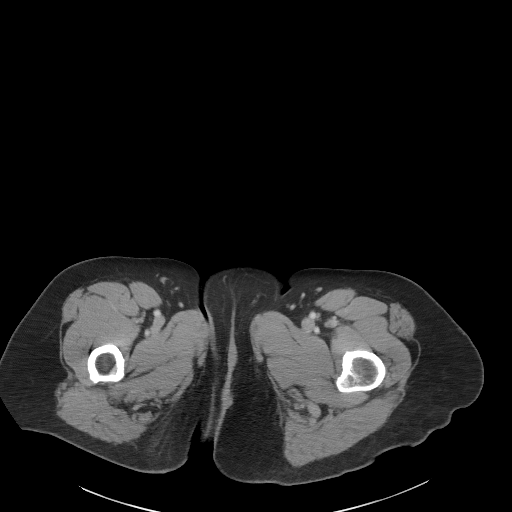
[im 5/94  bone]
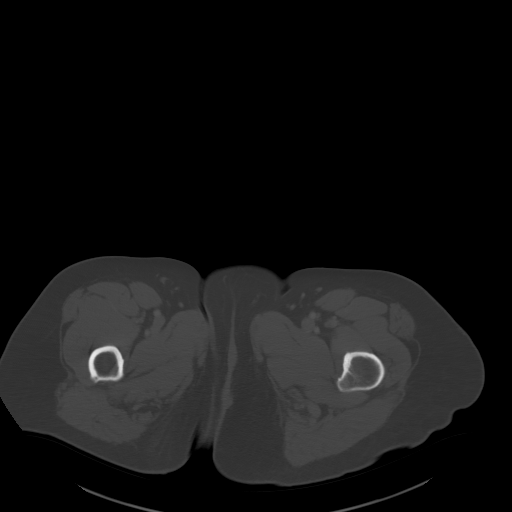
[im 15/94  soft-tissue]
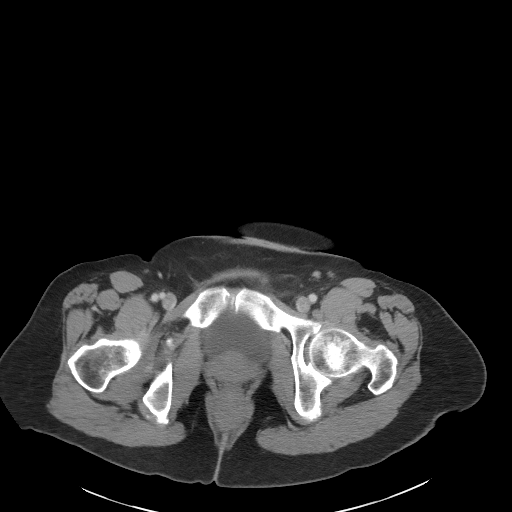
[im 20/94  soft-tissue]
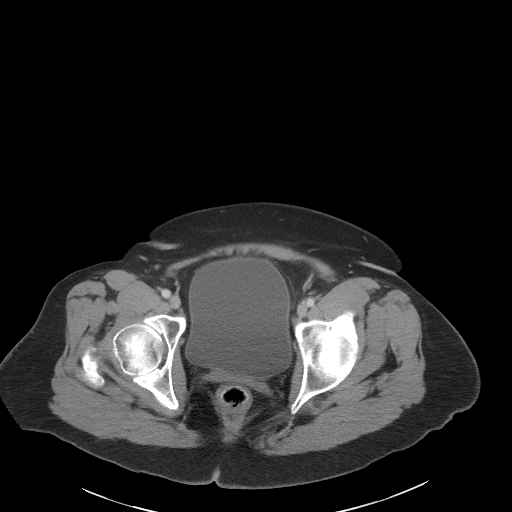
[im 25/94  soft-tissue]
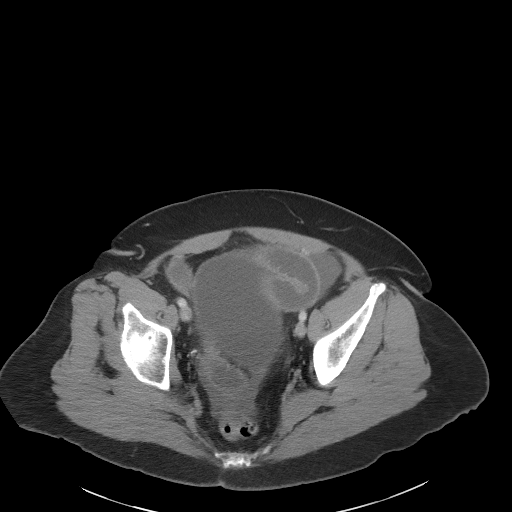
[im 35/94  soft-tissue]
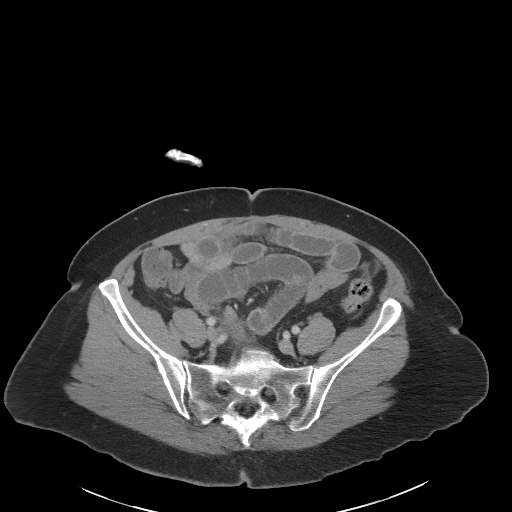
[im 40/94  soft-tissue]
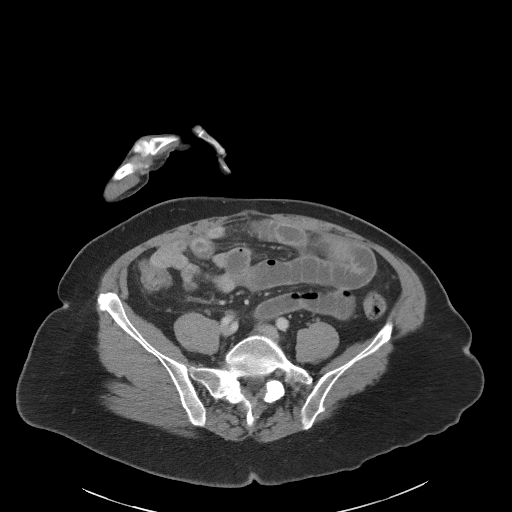
[im 49/94  soft-tissue]
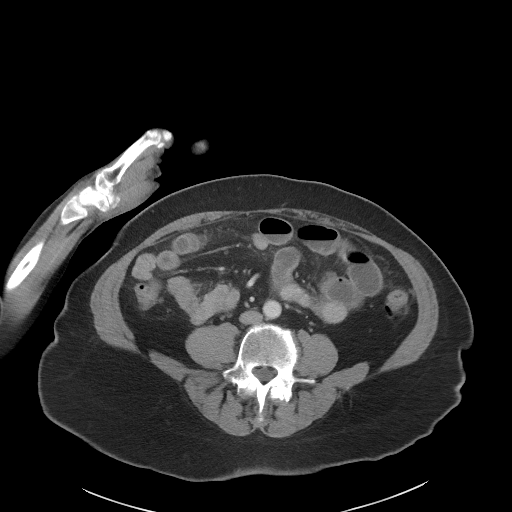
[im 54/94  soft-tissue]
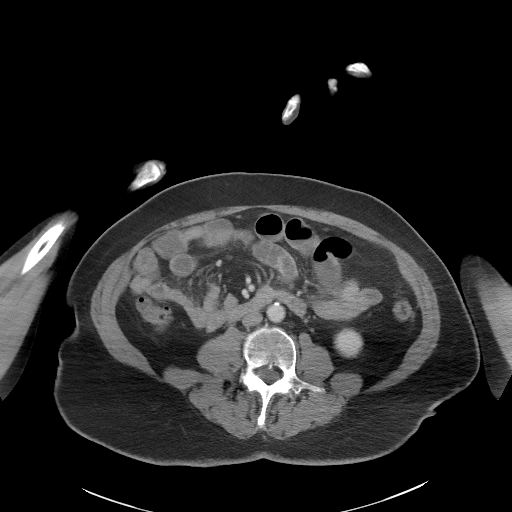
[im 59/94  soft-tissue]
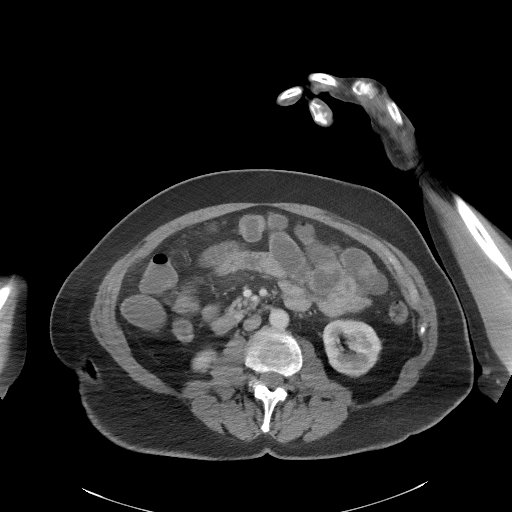
[im 59/94  bone]
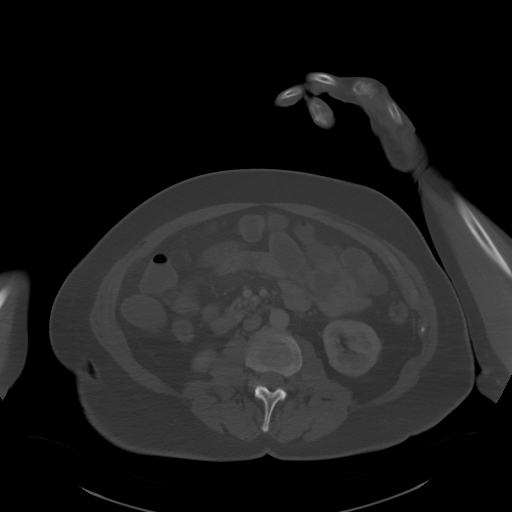
[im 69/94  soft-tissue]
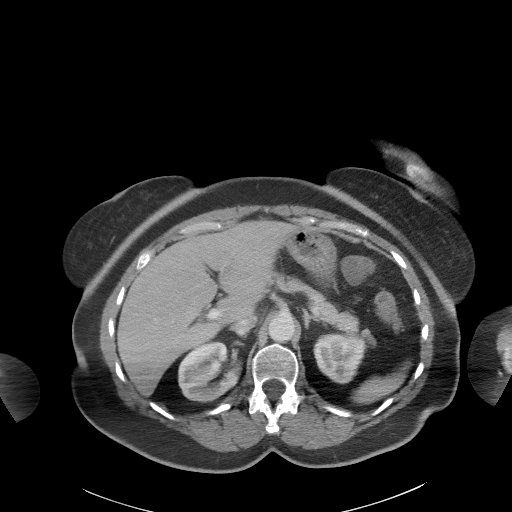
[im 74/94  soft-tissue]
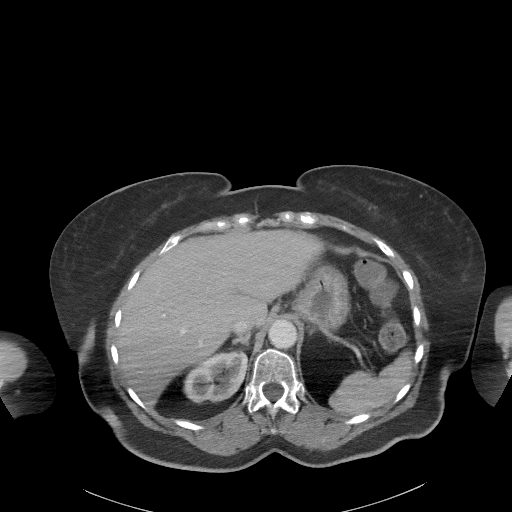
[im 79/94  soft-tissue]
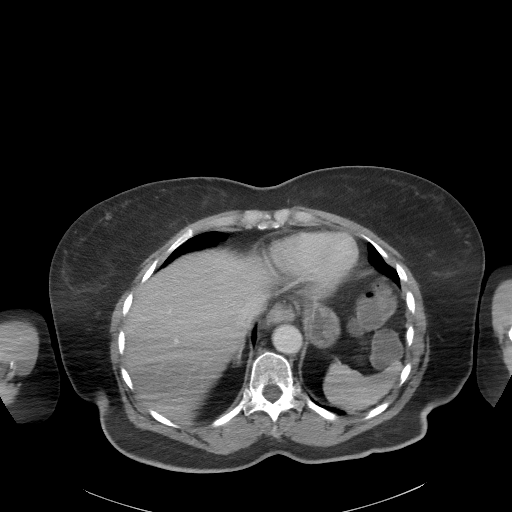
[im 89/94  soft-tissue]
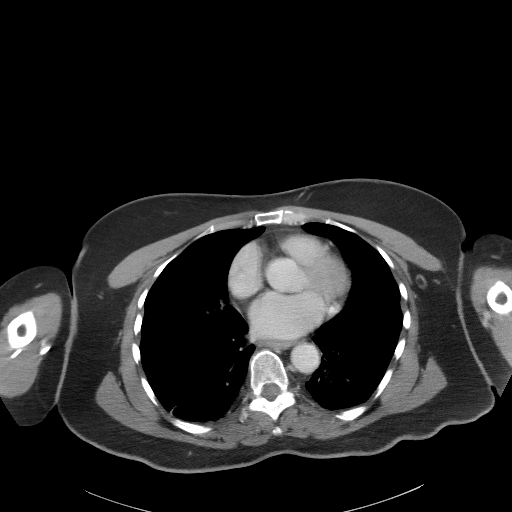

[Series 5: coronal st · coronal · 0.80mm/px · 3 of 91 slices shown]
[im 31/91  soft-tissue]
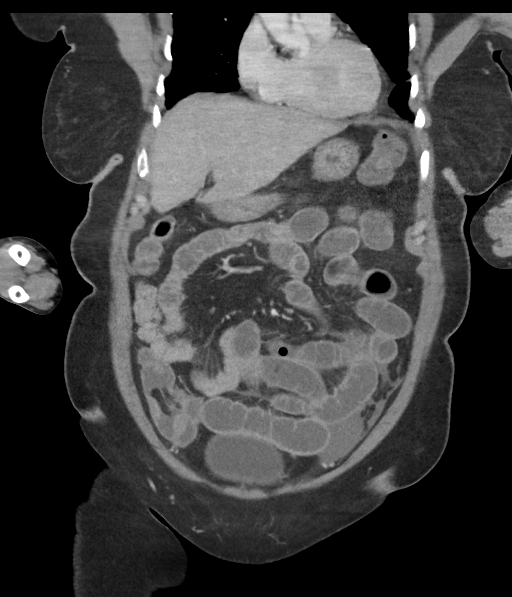
[im 41/91  soft-tissue]
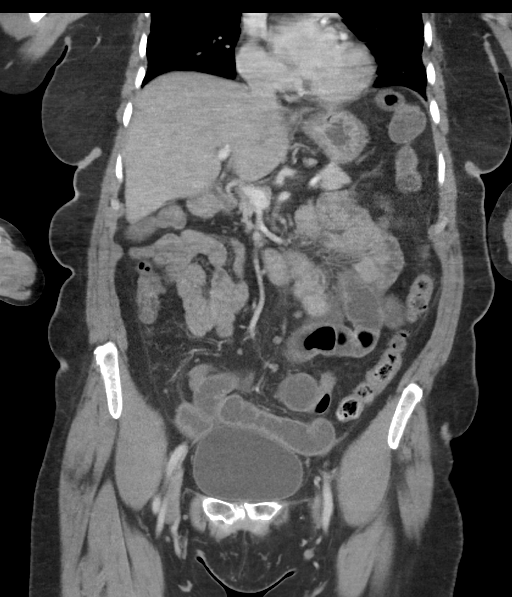
[im 51/91  soft-tissue]
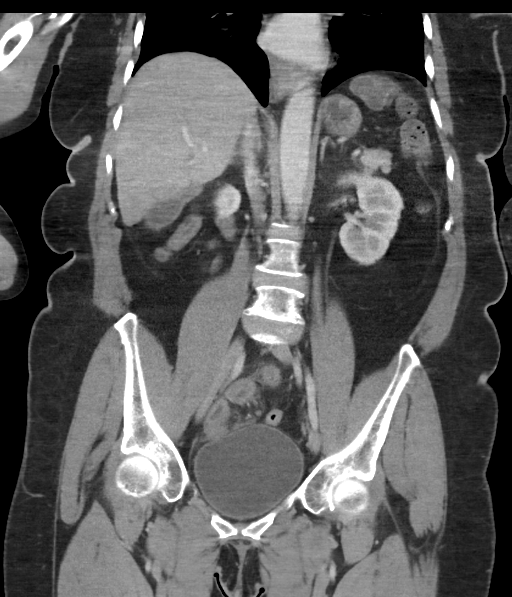

[16 of 46 positions shown; findings below may reference images not displayed]

FINDINGS: Lower chest: Scarring identified within both lung bases.

Hepatobiliary: No suspicious liver abnormality. Gallbladder is
unremarkable. No gallbladder wall inflammation. No bile duct
dilatation.

Pancreas: Unremarkable. No pancreatic ductal dilatation or
surrounding inflammatory changes.

Spleen: Normal in size without focal abnormality.

Adrenals/Urinary Tract: Normal appearance of the adrenal glands.
Right upper pole kidney cyst measures 1.4 cm. Additional, less than
1 cm low-density foci are identified, too small to characterize. No
suspicious enhancing mass or hydronephrosis identified bilaterally.
The urinary bladder is normal.

Stomach/Bowel: Stomach is nondistended. Proximal small bowel loops
are unremarkable. There is mild increase caliber of the mid small
bowel loops which measure up to 2.8 cm. There is a transition point
to decreased caliber distal small bowel within the right lower
quadrant of the abdomen, image 46/5 and image 62/2. Unremarkable
appearance of the colon.

Vascular/Lymphatic: Aortic atherosclerosis. No aneurysm. No
abdominopelvic adenopathy.

Reproductive: Status post hysterectomy. No adnexal masses.

Other: There is a small volume of free fluid identified within the
left lower quadrant of the abdomen and in the dependent portion of
the pelvis. No focal fluid collections identified.

Musculoskeletal: No acute or significant osseous findings.
IMPRESSION: 1. Increase caliber of the mid small bowel loops with transition to
decreased caliber distal small bowel. Findings are concerning for
low-grade partial obstruction of the small bowel.
2. Small volume of free fluid noted within the left lower quadrant
of the abdomen and dependent portion of the pelvis.

Aortic Atherosclerosis ([id number]).

## 2020-06-12 IMAGING — DX DG ABD PORTABLE 1V
1 series · 1 of 1 positions shown · non-contrast
Comparison: CT yesterday.

CLINICAL DATA: Small bowel obstruction, 8 hour delayed film.

EXAM:
PORTABLE ABDOMEN - 1 VIEW

[abdomen kub]
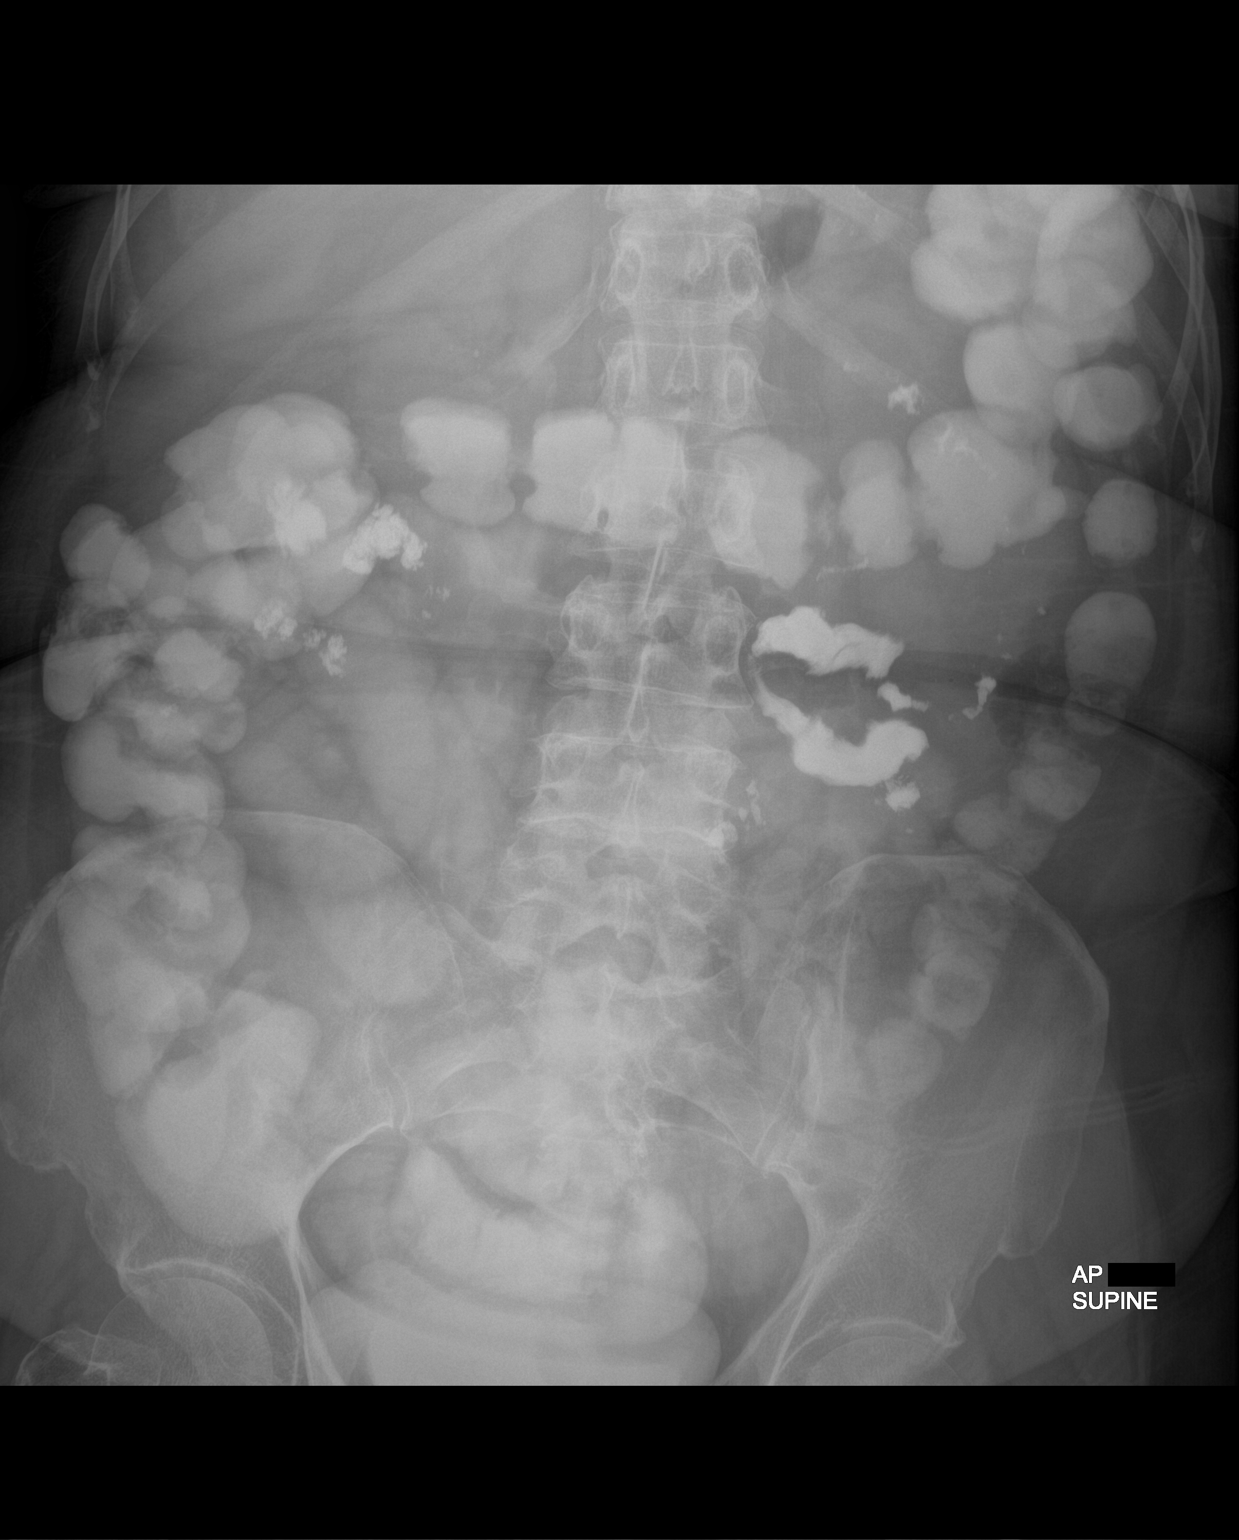

[1 of 1 positions shown; findings below may reference images not displayed]

FINDINGS: There is enteric contrast in the ascending, transverse, descending
and sigmoid colon. Small amount of residual contrast also within the
small bowel. Excreted IV contrast in the urinary bladder. No
evidence of free air.
IMPRESSION: Enteric contrast has reached the ascending, transverse, descending
and sigmoid colon.

## 2020-06-21 DIAGNOSIS — N898 Other specified noninflammatory disorders of vagina: Secondary | ICD-10-CM | POA: Diagnosis not present

## 2020-06-22 DIAGNOSIS — G309 Alzheimer's disease, unspecified: Secondary | ICD-10-CM | POA: Diagnosis not present

## 2020-06-22 DIAGNOSIS — E785 Hyperlipidemia, unspecified: Secondary | ICD-10-CM | POA: Diagnosis not present

## 2020-06-22 DIAGNOSIS — G4700 Insomnia, unspecified: Secondary | ICD-10-CM | POA: Diagnosis not present

## 2020-06-22 DIAGNOSIS — K449 Diaphragmatic hernia without obstruction or gangrene: Secondary | ICD-10-CM | POA: Diagnosis not present

## 2020-06-22 DIAGNOSIS — K5901 Slow transit constipation: Secondary | ICD-10-CM | POA: Diagnosis not present

## 2020-06-22 DIAGNOSIS — I159 Secondary hypertension, unspecified: Secondary | ICD-10-CM | POA: Diagnosis not present

## 2020-06-22 DIAGNOSIS — E559 Vitamin D deficiency, unspecified: Secondary | ICD-10-CM | POA: Diagnosis not present

## 2020-06-22 DIAGNOSIS — F0281 Dementia in other diseases classified elsewhere with behavioral disturbance: Secondary | ICD-10-CM | POA: Diagnosis not present

## 2020-06-22 DIAGNOSIS — F418 Other specified anxiety disorders: Secondary | ICD-10-CM | POA: Diagnosis not present

## 2020-06-27 DIAGNOSIS — R2241 Localized swelling, mass and lump, right lower limb: Secondary | ICD-10-CM | POA: Diagnosis not present

## 2020-06-27 DIAGNOSIS — M25551 Pain in right hip: Secondary | ICD-10-CM | POA: Diagnosis not present

## 2020-06-27 DIAGNOSIS — M19071 Primary osteoarthritis, right ankle and foot: Secondary | ICD-10-CM | POA: Diagnosis not present

## 2020-06-27 DIAGNOSIS — M5136 Other intervertebral disc degeneration, lumbar region: Secondary | ICD-10-CM | POA: Diagnosis not present

## 2020-06-27 DIAGNOSIS — G309 Alzheimer's disease, unspecified: Secondary | ICD-10-CM | POA: Diagnosis not present

## 2020-06-27 DIAGNOSIS — M1611 Unilateral primary osteoarthritis, right hip: Secondary | ICD-10-CM | POA: Diagnosis not present

## 2020-06-27 DIAGNOSIS — M2011 Hallux valgus (acquired), right foot: Secondary | ICD-10-CM | POA: Diagnosis not present

## 2020-06-27 DIAGNOSIS — M545 Low back pain, unspecified: Secondary | ICD-10-CM | POA: Diagnosis not present

## 2020-06-27 DIAGNOSIS — M7989 Other specified soft tissue disorders: Secondary | ICD-10-CM | POA: Diagnosis not present

## 2020-06-27 DIAGNOSIS — M79671 Pain in right foot: Secondary | ICD-10-CM | POA: Diagnosis not present

## 2020-06-27 DIAGNOSIS — F0281 Dementia in other diseases classified elsewhere with behavioral disturbance: Secondary | ICD-10-CM | POA: Diagnosis not present

## 2020-06-27 DIAGNOSIS — D6489 Other specified anemias: Secondary | ICD-10-CM | POA: Diagnosis not present

## 2020-06-27 DIAGNOSIS — N189 Chronic kidney disease, unspecified: Secondary | ICD-10-CM | POA: Diagnosis not present

## 2020-06-29 DIAGNOSIS — L720 Epidermal cyst: Secondary | ICD-10-CM | POA: Diagnosis not present

## 2020-07-17 DIAGNOSIS — K449 Diaphragmatic hernia without obstruction or gangrene: Secondary | ICD-10-CM | POA: Diagnosis not present

## 2020-07-17 DIAGNOSIS — F418 Other specified anxiety disorders: Secondary | ICD-10-CM | POA: Diagnosis not present

## 2020-07-17 DIAGNOSIS — K5901 Slow transit constipation: Secondary | ICD-10-CM | POA: Diagnosis not present

## 2020-07-17 DIAGNOSIS — I159 Secondary hypertension, unspecified: Secondary | ICD-10-CM | POA: Diagnosis not present

## 2020-07-17 DIAGNOSIS — G309 Alzheimer's disease, unspecified: Secondary | ICD-10-CM | POA: Diagnosis not present

## 2020-07-17 DIAGNOSIS — E559 Vitamin D deficiency, unspecified: Secondary | ICD-10-CM | POA: Diagnosis not present

## 2020-07-17 DIAGNOSIS — G4700 Insomnia, unspecified: Secondary | ICD-10-CM | POA: Diagnosis not present

## 2020-07-17 DIAGNOSIS — F0281 Dementia in other diseases classified elsewhere with behavioral disturbance: Secondary | ICD-10-CM | POA: Diagnosis not present

## 2020-07-17 DIAGNOSIS — E785 Hyperlipidemia, unspecified: Secondary | ICD-10-CM | POA: Diagnosis not present

## 2020-08-28 DIAGNOSIS — E785 Hyperlipidemia, unspecified: Secondary | ICD-10-CM | POA: Diagnosis not present

## 2020-08-28 DIAGNOSIS — K449 Diaphragmatic hernia without obstruction or gangrene: Secondary | ICD-10-CM | POA: Diagnosis not present

## 2020-08-28 DIAGNOSIS — E559 Vitamin D deficiency, unspecified: Secondary | ICD-10-CM | POA: Diagnosis not present

## 2020-08-28 DIAGNOSIS — K5901 Slow transit constipation: Secondary | ICD-10-CM | POA: Diagnosis not present

## 2020-08-28 DIAGNOSIS — G4700 Insomnia, unspecified: Secondary | ICD-10-CM | POA: Diagnosis not present

## 2020-08-28 DIAGNOSIS — F0281 Dementia in other diseases classified elsewhere with behavioral disturbance: Secondary | ICD-10-CM | POA: Diagnosis not present

## 2020-08-28 DIAGNOSIS — F418 Other specified anxiety disorders: Secondary | ICD-10-CM | POA: Diagnosis not present

## 2020-08-28 DIAGNOSIS — G309 Alzheimer's disease, unspecified: Secondary | ICD-10-CM | POA: Diagnosis not present

## 2020-08-28 DIAGNOSIS — M25551 Pain in right hip: Secondary | ICD-10-CM | POA: Diagnosis not present

## 2020-08-28 DIAGNOSIS — I159 Secondary hypertension, unspecified: Secondary | ICD-10-CM | POA: Diagnosis not present

## 2020-08-29 DIAGNOSIS — E1169 Type 2 diabetes mellitus with other specified complication: Secondary | ICD-10-CM | POA: Diagnosis not present

## 2020-10-02 DIAGNOSIS — R300 Dysuria: Secondary | ICD-10-CM | POA: Diagnosis not present

## 2020-10-02 DIAGNOSIS — R82998 Other abnormal findings in urine: Secondary | ICD-10-CM | POA: Diagnosis not present

## 2020-10-02 DIAGNOSIS — M25552 Pain in left hip: Secondary | ICD-10-CM | POA: Diagnosis not present

## 2020-10-16 DIAGNOSIS — E785 Hyperlipidemia, unspecified: Secondary | ICD-10-CM | POA: Diagnosis not present

## 2020-10-16 DIAGNOSIS — G309 Alzheimer's disease, unspecified: Secondary | ICD-10-CM | POA: Diagnosis not present

## 2020-10-16 DIAGNOSIS — E559 Vitamin D deficiency, unspecified: Secondary | ICD-10-CM | POA: Diagnosis not present

## 2020-10-16 DIAGNOSIS — K5901 Slow transit constipation: Secondary | ICD-10-CM | POA: Diagnosis not present

## 2020-10-16 DIAGNOSIS — K449 Diaphragmatic hernia without obstruction or gangrene: Secondary | ICD-10-CM | POA: Diagnosis not present

## 2020-10-16 DIAGNOSIS — F0281 Dementia in other diseases classified elsewhere with behavioral disturbance: Secondary | ICD-10-CM | POA: Diagnosis not present

## 2020-10-16 DIAGNOSIS — F418 Other specified anxiety disorders: Secondary | ICD-10-CM | POA: Diagnosis not present

## 2020-10-16 DIAGNOSIS — I159 Secondary hypertension, unspecified: Secondary | ICD-10-CM | POA: Diagnosis not present

## 2020-11-11 DIAGNOSIS — R079 Chest pain, unspecified: Secondary | ICD-10-CM | POA: Diagnosis not present

## 2020-11-11 DIAGNOSIS — F0280 Dementia in other diseases classified elsewhere without behavioral disturbance: Secondary | ICD-10-CM | POA: Diagnosis not present

## 2020-11-11 DIAGNOSIS — U071 COVID-19: Secondary | ICD-10-CM | POA: Diagnosis not present

## 2020-11-11 DIAGNOSIS — G309 Alzheimer's disease, unspecified: Secondary | ICD-10-CM | POA: Diagnosis not present

## 2020-11-11 DIAGNOSIS — R0981 Nasal congestion: Secondary | ICD-10-CM | POA: Diagnosis not present

## 2020-11-11 DIAGNOSIS — Z79899 Other long term (current) drug therapy: Secondary | ICD-10-CM | POA: Diagnosis not present

## 2020-11-20 DIAGNOSIS — I159 Secondary hypertension, unspecified: Secondary | ICD-10-CM | POA: Diagnosis not present

## 2020-11-20 DIAGNOSIS — E785 Hyperlipidemia, unspecified: Secondary | ICD-10-CM | POA: Diagnosis not present

## 2020-11-20 DIAGNOSIS — E559 Vitamin D deficiency, unspecified: Secondary | ICD-10-CM | POA: Diagnosis not present

## 2020-11-20 DIAGNOSIS — F418 Other specified anxiety disorders: Secondary | ICD-10-CM | POA: Diagnosis not present

## 2020-11-20 DIAGNOSIS — G4700 Insomnia, unspecified: Secondary | ICD-10-CM | POA: Diagnosis not present

## 2020-11-20 DIAGNOSIS — F0281 Dementia in other diseases classified elsewhere with behavioral disturbance: Secondary | ICD-10-CM | POA: Diagnosis not present

## 2020-11-20 DIAGNOSIS — K449 Diaphragmatic hernia without obstruction or gangrene: Secondary | ICD-10-CM | POA: Diagnosis not present

## 2020-11-20 DIAGNOSIS — G309 Alzheimer's disease, unspecified: Secondary | ICD-10-CM | POA: Diagnosis not present

## 2020-11-20 DIAGNOSIS — K5901 Slow transit constipation: Secondary | ICD-10-CM | POA: Diagnosis not present

## 2020-11-26 DIAGNOSIS — R109 Unspecified abdominal pain: Secondary | ICD-10-CM | POA: Diagnosis not present

## 2020-11-26 DIAGNOSIS — F0390 Unspecified dementia without behavioral disturbance: Secondary | ICD-10-CM | POA: Diagnosis not present

## 2020-11-26 DIAGNOSIS — Z79899 Other long term (current) drug therapy: Secondary | ICD-10-CM | POA: Diagnosis not present

## 2020-11-26 DIAGNOSIS — R1032 Left lower quadrant pain: Secondary | ICD-10-CM | POA: Diagnosis not present

## 2020-11-26 DIAGNOSIS — R630 Anorexia: Secondary | ICD-10-CM | POA: Diagnosis not present

## 2020-12-11 DIAGNOSIS — R300 Dysuria: Secondary | ICD-10-CM | POA: Diagnosis not present

## 2020-12-11 DIAGNOSIS — N390 Urinary tract infection, site not specified: Secondary | ICD-10-CM | POA: Diagnosis not present

## 2020-12-11 DIAGNOSIS — R195 Other fecal abnormalities: Secondary | ICD-10-CM | POA: Diagnosis not present

## 2020-12-12 DIAGNOSIS — R197 Diarrhea, unspecified: Secondary | ICD-10-CM | POA: Diagnosis not present

## 2020-12-12 DIAGNOSIS — Z79899 Other long term (current) drug therapy: Secondary | ICD-10-CM | POA: Diagnosis not present

## 2020-12-24 DIAGNOSIS — I10 Essential (primary) hypertension: Secondary | ICD-10-CM | POA: Diagnosis not present

## 2020-12-24 DIAGNOSIS — Z79899 Other long term (current) drug therapy: Secondary | ICD-10-CM | POA: Diagnosis not present

## 2020-12-26 DIAGNOSIS — M109 Gout, unspecified: Secondary | ICD-10-CM | POA: Diagnosis not present

## 2020-12-26 DIAGNOSIS — D509 Iron deficiency anemia, unspecified: Secondary | ICD-10-CM | POA: Diagnosis not present

## 2020-12-26 DIAGNOSIS — I10 Essential (primary) hypertension: Secondary | ICD-10-CM | POA: Diagnosis not present

## 2020-12-26 DIAGNOSIS — E559 Vitamin D deficiency, unspecified: Secondary | ICD-10-CM | POA: Diagnosis not present

## 2020-12-26 DIAGNOSIS — E785 Hyperlipidemia, unspecified: Secondary | ICD-10-CM | POA: Diagnosis not present

## 2020-12-26 DIAGNOSIS — E119 Type 2 diabetes mellitus without complications: Secondary | ICD-10-CM | POA: Diagnosis not present

## 2020-12-26 DIAGNOSIS — Z131 Encounter for screening for diabetes mellitus: Secondary | ICD-10-CM | POA: Diagnosis not present

## 2020-12-26 DIAGNOSIS — Z136 Encounter for screening for cardiovascular disorders: Secondary | ICD-10-CM | POA: Diagnosis not present

## 2021-01-03 DIAGNOSIS — R195 Other fecal abnormalities: Secondary | ICD-10-CM | POA: Diagnosis not present

## 2021-01-09 DIAGNOSIS — Z131 Encounter for screening for diabetes mellitus: Secondary | ICD-10-CM | POA: Diagnosis not present

## 2021-01-09 DIAGNOSIS — I10 Essential (primary) hypertension: Secondary | ICD-10-CM | POA: Diagnosis not present

## 2021-01-09 DIAGNOSIS — G300 Alzheimer's disease with early onset: Secondary | ICD-10-CM | POA: Diagnosis not present

## 2021-01-09 DIAGNOSIS — Z136 Encounter for screening for cardiovascular disorders: Secondary | ICD-10-CM | POA: Diagnosis not present

## 2021-01-09 DIAGNOSIS — R600 Localized edema: Secondary | ICD-10-CM | POA: Diagnosis not present

## 2021-01-09 DIAGNOSIS — F0281 Dementia in other diseases classified elsewhere with behavioral disturbance: Secondary | ICD-10-CM | POA: Diagnosis not present

## 2021-01-09 DIAGNOSIS — M109 Gout, unspecified: Secondary | ICD-10-CM | POA: Diagnosis not present

## 2021-01-09 DIAGNOSIS — E119 Type 2 diabetes mellitus without complications: Secondary | ICD-10-CM | POA: Diagnosis not present

## 2021-01-09 DIAGNOSIS — N390 Urinary tract infection, site not specified: Secondary | ICD-10-CM | POA: Diagnosis not present

## 2021-01-14 DIAGNOSIS — R600 Localized edema: Secondary | ICD-10-CM | POA: Diagnosis not present

## 2021-02-08 DIAGNOSIS — Z01818 Encounter for other preprocedural examination: Secondary | ICD-10-CM | POA: Diagnosis not present

## 2021-02-08 DIAGNOSIS — K2950 Unspecified chronic gastritis without bleeding: Secondary | ICD-10-CM | POA: Diagnosis not present

## 2021-02-08 DIAGNOSIS — I10 Essential (primary) hypertension: Secondary | ICD-10-CM | POA: Diagnosis not present

## 2021-02-08 DIAGNOSIS — R195 Other fecal abnormalities: Secondary | ICD-10-CM | POA: Diagnosis not present

## 2021-02-08 DIAGNOSIS — K2100 Gastro-esophageal reflux disease with esophagitis, without bleeding: Secondary | ICD-10-CM | POA: Diagnosis not present

## 2021-02-08 DIAGNOSIS — D122 Benign neoplasm of ascending colon: Secondary | ICD-10-CM | POA: Diagnosis not present

## 2021-02-08 DIAGNOSIS — E119 Type 2 diabetes mellitus without complications: Secondary | ICD-10-CM | POA: Diagnosis not present

## 2021-02-08 DIAGNOSIS — K5730 Diverticulosis of large intestine without perforation or abscess without bleeding: Secondary | ICD-10-CM | POA: Diagnosis not present

## 2021-02-08 DIAGNOSIS — K317 Polyp of stomach and duodenum: Secondary | ICD-10-CM | POA: Diagnosis not present

## 2021-02-08 DIAGNOSIS — F0394 Unspecified dementia, unspecified severity, with anxiety: Secondary | ICD-10-CM | POA: Diagnosis not present

## 2021-02-08 DIAGNOSIS — K209 Esophagitis, unspecified without bleeding: Secondary | ICD-10-CM | POA: Diagnosis not present

## 2021-02-08 DIAGNOSIS — K635 Polyp of colon: Secondary | ICD-10-CM | POA: Diagnosis not present

## 2021-02-08 DIAGNOSIS — E785 Hyperlipidemia, unspecified: Secondary | ICD-10-CM | POA: Diagnosis not present

## 2021-02-08 DIAGNOSIS — D124 Benign neoplasm of descending colon: Secondary | ICD-10-CM | POA: Diagnosis not present

## 2021-02-08 DIAGNOSIS — D649 Anemia, unspecified: Secondary | ICD-10-CM | POA: Diagnosis not present

## 2021-02-20 DIAGNOSIS — R600 Localized edema: Secondary | ICD-10-CM | POA: Diagnosis not present

## 2021-02-20 DIAGNOSIS — F02818 Dementia in other diseases classified elsewhere, unspecified severity, with other behavioral disturbance: Secondary | ICD-10-CM | POA: Diagnosis not present

## 2021-02-20 DIAGNOSIS — I10 Essential (primary) hypertension: Secondary | ICD-10-CM | POA: Diagnosis not present

## 2021-02-20 DIAGNOSIS — G300 Alzheimer's disease with early onset: Secondary | ICD-10-CM | POA: Diagnosis not present

## 2021-02-20 DIAGNOSIS — M1000 Idiopathic gout, unspecified site: Secondary | ICD-10-CM | POA: Diagnosis not present

## 2021-05-17 DIAGNOSIS — Z1231 Encounter for screening mammogram for malignant neoplasm of breast: Secondary | ICD-10-CM | POA: Diagnosis not present

## 2021-06-25 DIAGNOSIS — F0390 Unspecified dementia without behavioral disturbance: Secondary | ICD-10-CM | POA: Diagnosis not present

## 2021-06-25 DIAGNOSIS — I10 Essential (primary) hypertension: Secondary | ICD-10-CM | POA: Diagnosis not present

## 2021-07-22 DIAGNOSIS — R195 Other fecal abnormalities: Secondary | ICD-10-CM | POA: Diagnosis not present

## 2021-07-22 DIAGNOSIS — D509 Iron deficiency anemia, unspecified: Secondary | ICD-10-CM | POA: Diagnosis not present

## 2021-07-22 DIAGNOSIS — B079 Viral wart, unspecified: Secondary | ICD-10-CM | POA: Diagnosis not present

## 2021-08-07 DIAGNOSIS — M25562 Pain in left knee: Secondary | ICD-10-CM | POA: Diagnosis not present

## 2021-08-07 DIAGNOSIS — R404 Transient alteration of awareness: Secondary | ICD-10-CM | POA: Diagnosis not present

## 2021-08-07 DIAGNOSIS — I119 Hypertensive heart disease without heart failure: Secondary | ICD-10-CM | POA: Diagnosis not present

## 2021-08-07 DIAGNOSIS — M25561 Pain in right knee: Secondary | ICD-10-CM | POA: Diagnosis not present

## 2021-08-07 DIAGNOSIS — R918 Other nonspecific abnormal finding of lung field: Secondary | ICD-10-CM | POA: Diagnosis not present

## 2021-08-07 DIAGNOSIS — F0393 Unspecified dementia, unspecified severity, with mood disturbance: Secondary | ICD-10-CM | POA: Diagnosis not present

## 2021-08-07 DIAGNOSIS — F0394 Unspecified dementia, unspecified severity, with anxiety: Secondary | ICD-10-CM | POA: Diagnosis not present

## 2021-08-07 DIAGNOSIS — F03911 Unspecified dementia, unspecified severity, with agitation: Secondary | ICD-10-CM | POA: Diagnosis not present

## 2021-08-07 DIAGNOSIS — R531 Weakness: Secondary | ICD-10-CM | POA: Diagnosis not present

## 2021-08-09 DIAGNOSIS — G301 Alzheimer's disease with late onset: Secondary | ICD-10-CM | POA: Diagnosis not present

## 2021-08-09 DIAGNOSIS — R195 Other fecal abnormalities: Secondary | ICD-10-CM | POA: Diagnosis not present

## 2021-08-09 DIAGNOSIS — F39 Unspecified mood [affective] disorder: Secondary | ICD-10-CM | POA: Diagnosis not present

## 2021-08-09 DIAGNOSIS — F0280 Dementia in other diseases classified elsewhere without behavioral disturbance: Secondary | ICD-10-CM | POA: Diagnosis not present

## 2021-08-09 DIAGNOSIS — F0630 Mood disorder due to known physiological condition, unspecified: Secondary | ICD-10-CM | POA: Diagnosis not present

## 2021-08-21 DIAGNOSIS — B351 Tinea unguium: Secondary | ICD-10-CM | POA: Diagnosis not present

## 2021-08-21 DIAGNOSIS — B079 Viral wart, unspecified: Secondary | ICD-10-CM | POA: Diagnosis not present

## 2021-08-21 DIAGNOSIS — L821 Other seborrheic keratosis: Secondary | ICD-10-CM | POA: Diagnosis not present

## 2021-09-17 DIAGNOSIS — D2261 Melanocytic nevi of right upper limb, including shoulder: Secondary | ICD-10-CM | POA: Diagnosis not present

## 2021-09-17 DIAGNOSIS — L538 Other specified erythematous conditions: Secondary | ICD-10-CM | POA: Diagnosis not present

## 2021-09-17 DIAGNOSIS — D2262 Melanocytic nevi of left upper limb, including shoulder: Secondary | ICD-10-CM | POA: Diagnosis not present

## 2021-09-17 DIAGNOSIS — D225 Melanocytic nevi of trunk: Secondary | ICD-10-CM | POA: Diagnosis not present

## 2021-09-17 DIAGNOSIS — D2239 Melanocytic nevi of other parts of face: Secondary | ICD-10-CM | POA: Diagnosis not present

## 2021-09-17 DIAGNOSIS — L821 Other seborrheic keratosis: Secondary | ICD-10-CM | POA: Diagnosis not present

## 2021-09-17 DIAGNOSIS — L820 Inflamed seborrheic keratosis: Secondary | ICD-10-CM | POA: Diagnosis not present

## 2021-09-17 DIAGNOSIS — L814 Other melanin hyperpigmentation: Secondary | ICD-10-CM | POA: Diagnosis not present

## 2021-09-17 DIAGNOSIS — L298 Other pruritus: Secondary | ICD-10-CM | POA: Diagnosis not present

## 2021-10-17 DIAGNOSIS — H1032 Unspecified acute conjunctivitis, left eye: Secondary | ICD-10-CM | POA: Diagnosis not present

## 2021-10-17 DIAGNOSIS — M25562 Pain in left knee: Secondary | ICD-10-CM | POA: Diagnosis not present

## 2021-10-17 DIAGNOSIS — M25561 Pain in right knee: Secondary | ICD-10-CM | POA: Diagnosis not present

## 2021-12-14 DIAGNOSIS — F0283 Dementia in other diseases classified elsewhere, unspecified severity, with mood disturbance: Secondary | ICD-10-CM | POA: Diagnosis not present

## 2021-12-14 DIAGNOSIS — R262 Difficulty in walking, not elsewhere classified: Secondary | ICD-10-CM | POA: Diagnosis not present

## 2021-12-14 DIAGNOSIS — R41841 Cognitive communication deficit: Secondary | ICD-10-CM | POA: Diagnosis not present

## 2021-12-14 DIAGNOSIS — F39 Unspecified mood [affective] disorder: Secondary | ICD-10-CM | POA: Diagnosis not present

## 2021-12-14 DIAGNOSIS — G9341 Metabolic encephalopathy: Secondary | ICD-10-CM | POA: Diagnosis not present

## 2021-12-14 DIAGNOSIS — F0284 Dementia in other diseases classified elsewhere, unspecified severity, with anxiety: Secondary | ICD-10-CM | POA: Diagnosis not present

## 2021-12-14 DIAGNOSIS — N3000 Acute cystitis without hematuria: Secondary | ICD-10-CM | POA: Diagnosis not present

## 2021-12-14 DIAGNOSIS — G300 Alzheimer's disease with early onset: Secondary | ICD-10-CM | POA: Diagnosis not present

## 2021-12-14 DIAGNOSIS — R4182 Altered mental status, unspecified: Secondary | ICD-10-CM | POA: Diagnosis not present

## 2021-12-14 DIAGNOSIS — F02818 Dementia in other diseases classified elsewhere, unspecified severity, with other behavioral disturbance: Secondary | ICD-10-CM | POA: Diagnosis not present

## 2021-12-14 DIAGNOSIS — G9340 Encephalopathy, unspecified: Secondary | ICD-10-CM | POA: Diagnosis not present

## 2021-12-14 DIAGNOSIS — I10 Essential (primary) hypertension: Secondary | ICD-10-CM | POA: Diagnosis not present

## 2021-12-14 DIAGNOSIS — N3001 Acute cystitis with hematuria: Secondary | ICD-10-CM | POA: Diagnosis not present

## 2021-12-14 DIAGNOSIS — B9620 Unspecified Escherichia coli [E. coli] as the cause of diseases classified elsewhere: Secondary | ICD-10-CM | POA: Diagnosis not present

## 2021-12-15 DIAGNOSIS — G9340 Encephalopathy, unspecified: Secondary | ICD-10-CM | POA: Diagnosis not present

## 2021-12-16 DIAGNOSIS — G9340 Encephalopathy, unspecified: Secondary | ICD-10-CM | POA: Diagnosis not present

## 2021-12-17 DIAGNOSIS — G9340 Encephalopathy, unspecified: Secondary | ICD-10-CM | POA: Diagnosis not present

## 2021-12-18 DIAGNOSIS — G9340 Encephalopathy, unspecified: Secondary | ICD-10-CM | POA: Diagnosis not present

## 2021-12-24 DIAGNOSIS — R4182 Altered mental status, unspecified: Secondary | ICD-10-CM | POA: Diagnosis not present

## 2021-12-24 DIAGNOSIS — F02C11 Dementia in other diseases classified elsewhere, severe, with agitation: Secondary | ICD-10-CM | POA: Diagnosis not present

## 2021-12-24 DIAGNOSIS — Z20822 Contact with and (suspected) exposure to covid-19: Secondary | ICD-10-CM | POA: Diagnosis not present

## 2021-12-24 DIAGNOSIS — G4700 Insomnia, unspecified: Secondary | ICD-10-CM | POA: Diagnosis not present

## 2021-12-24 DIAGNOSIS — G9340 Encephalopathy, unspecified: Secondary | ICD-10-CM | POA: Diagnosis not present

## 2021-12-24 DIAGNOSIS — R451 Restlessness and agitation: Secondary | ICD-10-CM | POA: Diagnosis not present

## 2021-12-24 DIAGNOSIS — E119 Type 2 diabetes mellitus without complications: Secondary | ICD-10-CM | POA: Diagnosis not present

## 2021-12-24 DIAGNOSIS — F02C4 Dementia in other diseases classified elsewhere, severe, with anxiety: Secondary | ICD-10-CM | POA: Diagnosis not present

## 2021-12-24 DIAGNOSIS — G9341 Metabolic encephalopathy: Secondary | ICD-10-CM | POA: Diagnosis not present

## 2021-12-24 DIAGNOSIS — K6289 Other specified diseases of anus and rectum: Secondary | ICD-10-CM | POA: Diagnosis not present

## 2021-12-24 DIAGNOSIS — F02C18 Dementia in other diseases classified elsewhere, severe, with other behavioral disturbance: Secondary | ICD-10-CM | POA: Diagnosis not present

## 2021-12-24 DIAGNOSIS — F02C3 Dementia in other diseases classified elsewhere, severe, with mood disturbance: Secondary | ICD-10-CM | POA: Diagnosis not present

## 2021-12-24 DIAGNOSIS — R1031 Right lower quadrant pain: Secondary | ICD-10-CM | POA: Diagnosis not present

## 2021-12-25 DIAGNOSIS — F02C4 Dementia in other diseases classified elsewhere, severe, with anxiety: Secondary | ICD-10-CM | POA: Diagnosis not present

## 2021-12-25 DIAGNOSIS — R4182 Altered mental status, unspecified: Secondary | ICD-10-CM | POA: Diagnosis not present

## 2021-12-25 DIAGNOSIS — G4700 Insomnia, unspecified: Secondary | ICD-10-CM | POA: Diagnosis not present

## 2021-12-25 DIAGNOSIS — R1031 Right lower quadrant pain: Secondary | ICD-10-CM | POA: Diagnosis not present

## 2021-12-25 DIAGNOSIS — F02C3 Dementia in other diseases classified elsewhere, severe, with mood disturbance: Secondary | ICD-10-CM | POA: Diagnosis not present

## 2021-12-25 DIAGNOSIS — F02C18 Dementia in other diseases classified elsewhere, severe, with other behavioral disturbance: Secondary | ICD-10-CM | POA: Diagnosis not present

## 2021-12-25 DIAGNOSIS — G9340 Encephalopathy, unspecified: Secondary | ICD-10-CM | POA: Diagnosis not present

## 2021-12-25 DIAGNOSIS — Z20822 Contact with and (suspected) exposure to covid-19: Secondary | ICD-10-CM | POA: Diagnosis not present

## 2021-12-25 DIAGNOSIS — E119 Type 2 diabetes mellitus without complications: Secondary | ICD-10-CM | POA: Diagnosis not present

## 2021-12-25 DIAGNOSIS — G9341 Metabolic encephalopathy: Secondary | ICD-10-CM | POA: Diagnosis not present

## 2021-12-25 DIAGNOSIS — F02C11 Dementia in other diseases classified elsewhere, severe, with agitation: Secondary | ICD-10-CM | POA: Diagnosis not present

## 2021-12-25 DIAGNOSIS — K6289 Other specified diseases of anus and rectum: Secondary | ICD-10-CM | POA: Diagnosis not present

## 2021-12-27 DIAGNOSIS — F0283 Dementia in other diseases classified elsewhere, unspecified severity, with mood disturbance: Secondary | ICD-10-CM | POA: Diagnosis not present

## 2021-12-27 DIAGNOSIS — M109 Gout, unspecified: Secondary | ICD-10-CM | POA: Diagnosis not present

## 2021-12-27 DIAGNOSIS — F99 Mental disorder, not otherwise specified: Secondary | ICD-10-CM | POA: Diagnosis not present

## 2021-12-27 DIAGNOSIS — E119 Type 2 diabetes mellitus without complications: Secondary | ICD-10-CM | POA: Diagnosis not present

## 2021-12-27 DIAGNOSIS — N2889 Other specified disorders of kidney and ureter: Secondary | ICD-10-CM | POA: Diagnosis not present

## 2021-12-27 DIAGNOSIS — R279 Unspecified lack of coordination: Secondary | ICD-10-CM | POA: Diagnosis not present

## 2021-12-27 DIAGNOSIS — F02818 Dementia in other diseases classified elsewhere, unspecified severity, with other behavioral disturbance: Secondary | ICD-10-CM | POA: Diagnosis not present

## 2021-12-27 DIAGNOSIS — R4182 Altered mental status, unspecified: Secondary | ICD-10-CM | POA: Diagnosis not present

## 2021-12-27 DIAGNOSIS — M6281 Muscle weakness (generalized): Secondary | ICD-10-CM | POA: Diagnosis not present

## 2021-12-27 DIAGNOSIS — E785 Hyperlipidemia, unspecified: Secondary | ICD-10-CM | POA: Diagnosis not present

## 2021-12-27 DIAGNOSIS — I10 Essential (primary) hypertension: Secondary | ICD-10-CM | POA: Diagnosis not present

## 2021-12-27 DIAGNOSIS — R140 Abdominal distension (gaseous): Secondary | ICD-10-CM | POA: Diagnosis not present

## 2021-12-27 DIAGNOSIS — R5381 Other malaise: Secondary | ICD-10-CM | POA: Diagnosis not present

## 2021-12-27 DIAGNOSIS — F39 Unspecified mood [affective] disorder: Secondary | ICD-10-CM | POA: Diagnosis not present

## 2021-12-27 DIAGNOSIS — G300 Alzheimer's disease with early onset: Secondary | ICD-10-CM | POA: Diagnosis not present

## 2021-12-27 DIAGNOSIS — F03911 Unspecified dementia, unspecified severity, with agitation: Secondary | ICD-10-CM | POA: Diagnosis not present

## 2021-12-27 DIAGNOSIS — R109 Unspecified abdominal pain: Secondary | ICD-10-CM | POA: Diagnosis not present

## 2021-12-27 DIAGNOSIS — K5900 Constipation, unspecified: Secondary | ICD-10-CM | POA: Diagnosis not present

## 2022-01-08 DIAGNOSIS — F0630 Mood disorder due to known physiological condition, unspecified: Secondary | ICD-10-CM | POA: Diagnosis not present

## 2022-01-08 DIAGNOSIS — G301 Alzheimer's disease with late onset: Secondary | ICD-10-CM | POA: Diagnosis not present

## 2022-01-08 DIAGNOSIS — F0280 Dementia in other diseases classified elsewhere without behavioral disturbance: Secondary | ICD-10-CM | POA: Diagnosis not present

## 2022-01-08 DIAGNOSIS — R404 Transient alteration of awareness: Secondary | ICD-10-CM | POA: Diagnosis not present

## 2022-01-08 DIAGNOSIS — T50905A Adverse effect of unspecified drugs, medicaments and biological substances, initial encounter: Secondary | ICD-10-CM | POA: Diagnosis not present

## 2022-01-13 DIAGNOSIS — R000 Tachycardia, unspecified: Secondary | ICD-10-CM | POA: Diagnosis not present

## 2022-01-13 DIAGNOSIS — Z1624 Resistance to multiple antibiotics: Secondary | ICD-10-CM | POA: Diagnosis not present

## 2022-01-13 DIAGNOSIS — D72829 Elevated white blood cell count, unspecified: Secondary | ICD-10-CM | POA: Diagnosis not present

## 2022-01-13 DIAGNOSIS — F0280 Dementia in other diseases classified elsewhere without behavioral disturbance: Secondary | ICD-10-CM | POA: Diagnosis not present

## 2022-01-13 DIAGNOSIS — F0390 Unspecified dementia without behavioral disturbance: Secondary | ICD-10-CM | POA: Diagnosis not present

## 2022-01-13 DIAGNOSIS — E870 Hyperosmolality and hypernatremia: Secondary | ICD-10-CM | POA: Diagnosis not present

## 2022-01-13 DIAGNOSIS — F02811 Dementia in other diseases classified elsewhere, unspecified severity, with agitation: Secondary | ICD-10-CM | POA: Diagnosis not present

## 2022-01-13 DIAGNOSIS — G9389 Other specified disorders of brain: Secondary | ICD-10-CM | POA: Diagnosis not present

## 2022-01-13 DIAGNOSIS — E860 Dehydration: Secondary | ICD-10-CM | POA: Diagnosis not present

## 2022-01-13 DIAGNOSIS — G300 Alzheimer's disease with early onset: Secondary | ICD-10-CM | POA: Diagnosis not present

## 2022-01-13 DIAGNOSIS — L89159 Pressure ulcer of sacral region, unspecified stage: Secondary | ICD-10-CM | POA: Diagnosis not present

## 2022-01-13 DIAGNOSIS — G9341 Metabolic encephalopathy: Secondary | ICD-10-CM | POA: Diagnosis not present

## 2022-01-13 DIAGNOSIS — R7881 Bacteremia: Secondary | ICD-10-CM | POA: Diagnosis not present

## 2022-01-13 DIAGNOSIS — R7402 Elevation of levels of lactic acid dehydrogenase (LDH): Secondary | ICD-10-CM | POA: Diagnosis not present

## 2022-01-13 DIAGNOSIS — G9340 Encephalopathy, unspecified: Secondary | ICD-10-CM | POA: Diagnosis not present

## 2022-01-13 DIAGNOSIS — R109 Unspecified abdominal pain: Secondary | ICD-10-CM | POA: Diagnosis not present

## 2022-01-13 DIAGNOSIS — F03918 Unspecified dementia, unspecified severity, with other behavioral disturbance: Secondary | ICD-10-CM | POA: Diagnosis not present

## 2022-01-13 DIAGNOSIS — N179 Acute kidney failure, unspecified: Secondary | ICD-10-CM | POA: Diagnosis not present

## 2022-01-13 DIAGNOSIS — I959 Hypotension, unspecified: Secondary | ICD-10-CM | POA: Diagnosis not present

## 2022-01-13 DIAGNOSIS — I10 Essential (primary) hypertension: Secondary | ICD-10-CM | POA: Diagnosis not present

## 2022-01-13 DIAGNOSIS — Z79899 Other long term (current) drug therapy: Secondary | ICD-10-CM | POA: Diagnosis not present

## 2022-01-13 DIAGNOSIS — K5900 Constipation, unspecified: Secondary | ICD-10-CM | POA: Diagnosis not present

## 2022-01-13 DIAGNOSIS — E119 Type 2 diabetes mellitus without complications: Secondary | ICD-10-CM | POA: Diagnosis not present

## 2022-01-13 DIAGNOSIS — N390 Urinary tract infection, site not specified: Secondary | ICD-10-CM | POA: Diagnosis not present

## 2022-01-13 DIAGNOSIS — E861 Hypovolemia: Secondary | ICD-10-CM | POA: Diagnosis not present

## 2022-01-13 DIAGNOSIS — E869 Volume depletion, unspecified: Secondary | ICD-10-CM | POA: Diagnosis not present

## 2022-01-13 DIAGNOSIS — R0902 Hypoxemia: Secondary | ICD-10-CM | POA: Diagnosis not present

## 2022-01-13 DIAGNOSIS — N2889 Other specified disorders of kidney and ureter: Secondary | ICD-10-CM | POA: Diagnosis not present

## 2022-01-13 DIAGNOSIS — E872 Acidosis, unspecified: Secondary | ICD-10-CM | POA: Diagnosis not present

## 2022-01-13 DIAGNOSIS — R4182 Altered mental status, unspecified: Secondary | ICD-10-CM | POA: Diagnosis not present

## 2022-01-13 DIAGNOSIS — E1142 Type 2 diabetes mellitus with diabetic polyneuropathy: Secondary | ICD-10-CM | POA: Diagnosis not present

## 2022-01-13 DIAGNOSIS — Z7401 Bed confinement status: Secondary | ICD-10-CM | POA: Diagnosis not present

## 2022-01-20 DIAGNOSIS — E870 Hyperosmolality and hypernatremia: Secondary | ICD-10-CM | POA: Diagnosis not present

## 2022-01-20 DIAGNOSIS — G300 Alzheimer's disease with early onset: Secondary | ICD-10-CM | POA: Diagnosis not present

## 2022-01-20 DIAGNOSIS — F02818 Dementia in other diseases classified elsewhere, unspecified severity, with other behavioral disturbance: Secondary | ICD-10-CM | POA: Diagnosis not present

## 2022-01-20 DIAGNOSIS — I10 Essential (primary) hypertension: Secondary | ICD-10-CM | POA: Diagnosis not present

## 2022-01-24 DIAGNOSIS — L258 Unspecified contact dermatitis due to other agents: Secondary | ICD-10-CM | POA: Diagnosis not present

## 2022-01-24 DIAGNOSIS — R32 Unspecified urinary incontinence: Secondary | ICD-10-CM | POA: Diagnosis not present

## 2022-01-24 DIAGNOSIS — G300 Alzheimer's disease with early onset: Secondary | ICD-10-CM | POA: Diagnosis not present

## 2022-01-24 DIAGNOSIS — F02818 Dementia in other diseases classified elsewhere, unspecified severity, with other behavioral disturbance: Secondary | ICD-10-CM | POA: Diagnosis not present

## 2022-01-24 DIAGNOSIS — L308 Other specified dermatitis: Secondary | ICD-10-CM | POA: Diagnosis not present

## 2022-02-04 DIAGNOSIS — E119 Type 2 diabetes mellitus without complications: Secondary | ICD-10-CM | POA: Diagnosis not present

## 2022-02-04 DIAGNOSIS — E860 Dehydration: Secondary | ICD-10-CM | POA: Diagnosis not present

## 2022-02-04 DIAGNOSIS — Z20822 Contact with and (suspected) exposure to covid-19: Secondary | ICD-10-CM | POA: Diagnosis not present

## 2022-02-04 DIAGNOSIS — R457 State of emotional shock and stress, unspecified: Secondary | ICD-10-CM | POA: Diagnosis not present

## 2022-02-04 DIAGNOSIS — R269 Unspecified abnormalities of gait and mobility: Secondary | ICD-10-CM | POA: Diagnosis not present

## 2022-02-04 DIAGNOSIS — I10 Essential (primary) hypertension: Secondary | ICD-10-CM | POA: Diagnosis not present

## 2022-02-04 DIAGNOSIS — F0390 Unspecified dementia without behavioral disturbance: Secondary | ICD-10-CM | POA: Diagnosis not present

## 2022-02-04 DIAGNOSIS — R4181 Age-related cognitive decline: Secondary | ICD-10-CM | POA: Diagnosis not present

## 2022-02-04 DIAGNOSIS — R410 Disorientation, unspecified: Secondary | ICD-10-CM | POA: Diagnosis not present

## 2022-02-04 DIAGNOSIS — R4182 Altered mental status, unspecified: Secondary | ICD-10-CM | POA: Diagnosis not present

## 2022-02-04 DIAGNOSIS — Z1152 Encounter for screening for COVID-19: Secondary | ICD-10-CM | POA: Diagnosis not present

## 2022-02-04 DIAGNOSIS — M25559 Pain in unspecified hip: Secondary | ICD-10-CM | POA: Diagnosis not present

## 2022-02-04 DIAGNOSIS — M160 Bilateral primary osteoarthritis of hip: Secondary | ICD-10-CM | POA: Diagnosis not present

## 2022-02-05 DIAGNOSIS — M160 Bilateral primary osteoarthritis of hip: Secondary | ICD-10-CM | POA: Diagnosis not present

## 2022-02-05 DIAGNOSIS — M25559 Pain in unspecified hip: Secondary | ICD-10-CM | POA: Diagnosis not present

## 2022-02-06 DIAGNOSIS — F02C3 Dementia in other diseases classified elsewhere, severe, with mood disturbance: Secondary | ICD-10-CM | POA: Diagnosis not present

## 2022-02-06 DIAGNOSIS — N179 Acute kidney failure, unspecified: Secondary | ICD-10-CM | POA: Diagnosis not present

## 2022-02-06 DIAGNOSIS — L89313 Pressure ulcer of right buttock, stage 3: Secondary | ICD-10-CM | POA: Diagnosis not present

## 2022-02-06 DIAGNOSIS — F03918 Unspecified dementia, unspecified severity, with other behavioral disturbance: Secondary | ICD-10-CM | POA: Diagnosis not present

## 2022-02-06 DIAGNOSIS — R4182 Altered mental status, unspecified: Secondary | ICD-10-CM | POA: Diagnosis not present

## 2022-02-06 DIAGNOSIS — G300 Alzheimer's disease with early onset: Secondary | ICD-10-CM | POA: Diagnosis not present

## 2022-02-06 DIAGNOSIS — L89152 Pressure ulcer of sacral region, stage 2: Secondary | ICD-10-CM | POA: Diagnosis not present

## 2022-02-06 DIAGNOSIS — E860 Dehydration: Secondary | ICD-10-CM | POA: Diagnosis not present

## 2022-02-06 DIAGNOSIS — I959 Hypotension, unspecified: Secondary | ICD-10-CM | POA: Diagnosis not present

## 2022-02-06 DIAGNOSIS — K5900 Constipation, unspecified: Secondary | ICD-10-CM | POA: Diagnosis not present

## 2022-02-06 DIAGNOSIS — I10 Essential (primary) hypertension: Secondary | ICD-10-CM | POA: Diagnosis not present

## 2022-02-06 DIAGNOSIS — F03C2 Unspecified dementia, severe, with psychotic disturbance: Secondary | ICD-10-CM | POA: Diagnosis not present

## 2022-02-06 DIAGNOSIS — F0390 Unspecified dementia without behavioral disturbance: Secondary | ICD-10-CM | POA: Diagnosis not present

## 2022-02-06 DIAGNOSIS — L89310 Pressure ulcer of right buttock, unstageable: Secondary | ICD-10-CM | POA: Diagnosis not present

## 2022-02-06 DIAGNOSIS — L24A2 Irritant contact dermatitis due to fecal, urinary or dual incontinence: Secondary | ICD-10-CM | POA: Diagnosis not present

## 2022-02-06 DIAGNOSIS — M6281 Muscle weakness (generalized): Secondary | ICD-10-CM | POA: Diagnosis not present

## 2022-02-06 DIAGNOSIS — F02C11 Dementia in other diseases classified elsewhere, severe, with agitation: Secondary | ICD-10-CM | POA: Diagnosis not present

## 2022-02-06 DIAGNOSIS — F5105 Insomnia due to other mental disorder: Secondary | ICD-10-CM | POA: Diagnosis not present

## 2022-02-06 DIAGNOSIS — E119 Type 2 diabetes mellitus without complications: Secondary | ICD-10-CM | POA: Diagnosis not present

## 2022-02-06 DIAGNOSIS — G9341 Metabolic encephalopathy: Secondary | ICD-10-CM | POA: Diagnosis not present

## 2022-02-06 DIAGNOSIS — E870 Hyperosmolality and hypernatremia: Secondary | ICD-10-CM | POA: Diagnosis not present

## 2022-02-06 DIAGNOSIS — F418 Other specified anxiety disorders: Secondary | ICD-10-CM | POA: Diagnosis not present

## 2022-02-07 DIAGNOSIS — E119 Type 2 diabetes mellitus without complications: Secondary | ICD-10-CM | POA: Diagnosis not present

## 2022-02-07 DIAGNOSIS — L89310 Pressure ulcer of right buttock, unstageable: Secondary | ICD-10-CM | POA: Diagnosis not present

## 2022-02-07 DIAGNOSIS — M6281 Muscle weakness (generalized): Secondary | ICD-10-CM | POA: Diagnosis not present

## 2022-02-07 DIAGNOSIS — I10 Essential (primary) hypertension: Secondary | ICD-10-CM | POA: Diagnosis not present

## 2022-02-10 DIAGNOSIS — F0390 Unspecified dementia without behavioral disturbance: Secondary | ICD-10-CM | POA: Diagnosis not present

## 2022-02-10 DIAGNOSIS — M6281 Muscle weakness (generalized): Secondary | ICD-10-CM | POA: Diagnosis not present

## 2022-02-10 DIAGNOSIS — E119 Type 2 diabetes mellitus without complications: Secondary | ICD-10-CM | POA: Diagnosis not present

## 2022-02-10 DIAGNOSIS — I10 Essential (primary) hypertension: Secondary | ICD-10-CM | POA: Diagnosis not present

## 2022-02-11 DIAGNOSIS — L89310 Pressure ulcer of right buttock, unstageable: Secondary | ICD-10-CM | POA: Diagnosis not present

## 2022-02-11 DIAGNOSIS — I10 Essential (primary) hypertension: Secondary | ICD-10-CM | POA: Diagnosis not present

## 2022-02-11 DIAGNOSIS — E119 Type 2 diabetes mellitus without complications: Secondary | ICD-10-CM | POA: Diagnosis not present

## 2022-02-11 DIAGNOSIS — M6281 Muscle weakness (generalized): Secondary | ICD-10-CM | POA: Diagnosis not present

## 2022-02-12 DIAGNOSIS — I10 Essential (primary) hypertension: Secondary | ICD-10-CM | POA: Diagnosis not present

## 2022-02-12 DIAGNOSIS — L89310 Pressure ulcer of right buttock, unstageable: Secondary | ICD-10-CM | POA: Diagnosis not present

## 2022-02-12 DIAGNOSIS — M6281 Muscle weakness (generalized): Secondary | ICD-10-CM | POA: Diagnosis not present

## 2022-02-12 DIAGNOSIS — E119 Type 2 diabetes mellitus without complications: Secondary | ICD-10-CM | POA: Diagnosis not present

## 2022-02-13 DIAGNOSIS — L89313 Pressure ulcer of right buttock, stage 3: Secondary | ICD-10-CM | POA: Diagnosis not present

## 2022-02-13 DIAGNOSIS — L24A2 Irritant contact dermatitis due to fecal, urinary or dual incontinence: Secondary | ICD-10-CM | POA: Diagnosis not present

## 2022-02-13 DIAGNOSIS — M6281 Muscle weakness (generalized): Secondary | ICD-10-CM | POA: Diagnosis not present

## 2022-02-13 DIAGNOSIS — I10 Essential (primary) hypertension: Secondary | ICD-10-CM | POA: Diagnosis not present

## 2022-02-13 DIAGNOSIS — L89310 Pressure ulcer of right buttock, unstageable: Secondary | ICD-10-CM | POA: Diagnosis not present

## 2022-02-13 DIAGNOSIS — E119 Type 2 diabetes mellitus without complications: Secondary | ICD-10-CM | POA: Diagnosis not present

## 2022-02-14 DIAGNOSIS — E119 Type 2 diabetes mellitus without complications: Secondary | ICD-10-CM | POA: Diagnosis not present

## 2022-02-14 DIAGNOSIS — L89310 Pressure ulcer of right buttock, unstageable: Secondary | ICD-10-CM | POA: Diagnosis not present

## 2022-02-14 DIAGNOSIS — M6281 Muscle weakness (generalized): Secondary | ICD-10-CM | POA: Diagnosis not present

## 2022-02-14 DIAGNOSIS — I10 Essential (primary) hypertension: Secondary | ICD-10-CM | POA: Diagnosis not present

## 2022-02-17 DIAGNOSIS — E119 Type 2 diabetes mellitus without complications: Secondary | ICD-10-CM | POA: Diagnosis not present

## 2022-02-17 DIAGNOSIS — L89310 Pressure ulcer of right buttock, unstageable: Secondary | ICD-10-CM | POA: Diagnosis not present

## 2022-02-17 DIAGNOSIS — M6281 Muscle weakness (generalized): Secondary | ICD-10-CM | POA: Diagnosis not present

## 2022-02-17 DIAGNOSIS — I10 Essential (primary) hypertension: Secondary | ICD-10-CM | POA: Diagnosis not present

## 2022-02-18 DIAGNOSIS — F03C2 Unspecified dementia, severe, with psychotic disturbance: Secondary | ICD-10-CM | POA: Diagnosis not present

## 2022-02-18 DIAGNOSIS — F5105 Insomnia due to other mental disorder: Secondary | ICD-10-CM | POA: Diagnosis not present

## 2022-02-18 DIAGNOSIS — F418 Other specified anxiety disorders: Secondary | ICD-10-CM | POA: Diagnosis not present

## 2022-02-19 DIAGNOSIS — M6281 Muscle weakness (generalized): Secondary | ICD-10-CM | POA: Diagnosis not present

## 2022-02-19 DIAGNOSIS — E119 Type 2 diabetes mellitus without complications: Secondary | ICD-10-CM | POA: Diagnosis not present

## 2022-02-19 DIAGNOSIS — L89310 Pressure ulcer of right buttock, unstageable: Secondary | ICD-10-CM | POA: Diagnosis not present

## 2022-02-19 DIAGNOSIS — I10 Essential (primary) hypertension: Secondary | ICD-10-CM | POA: Diagnosis not present

## 2022-02-24 DIAGNOSIS — M6281 Muscle weakness (generalized): Secondary | ICD-10-CM | POA: Diagnosis not present

## 2022-02-24 DIAGNOSIS — L89310 Pressure ulcer of right buttock, unstageable: Secondary | ICD-10-CM | POA: Diagnosis not present

## 2022-02-24 DIAGNOSIS — E119 Type 2 diabetes mellitus without complications: Secondary | ICD-10-CM | POA: Diagnosis not present

## 2022-02-24 DIAGNOSIS — I10 Essential (primary) hypertension: Secondary | ICD-10-CM | POA: Diagnosis not present

## 2022-02-25 DIAGNOSIS — E119 Type 2 diabetes mellitus without complications: Secondary | ICD-10-CM | POA: Diagnosis not present

## 2022-02-25 DIAGNOSIS — M6281 Muscle weakness (generalized): Secondary | ICD-10-CM | POA: Diagnosis not present

## 2022-02-25 DIAGNOSIS — I10 Essential (primary) hypertension: Secondary | ICD-10-CM | POA: Diagnosis not present

## 2022-02-25 DIAGNOSIS — L89310 Pressure ulcer of right buttock, unstageable: Secondary | ICD-10-CM | POA: Diagnosis not present

## 2022-02-27 DIAGNOSIS — L89313 Pressure ulcer of right buttock, stage 3: Secondary | ICD-10-CM | POA: Diagnosis not present

## 2022-03-06 DIAGNOSIS — G300 Alzheimer's disease with early onset: Secondary | ICD-10-CM | POA: Diagnosis not present

## 2022-03-06 DIAGNOSIS — F02818 Dementia in other diseases classified elsewhere, unspecified severity, with other behavioral disturbance: Secondary | ICD-10-CM | POA: Diagnosis not present

## 2022-03-10 DIAGNOSIS — L24A2 Irritant contact dermatitis due to fecal, urinary or dual incontinence: Secondary | ICD-10-CM | POA: Diagnosis not present

## 2022-03-10 DIAGNOSIS — F02C11 Dementia in other diseases classified elsewhere, severe, with agitation: Secondary | ICD-10-CM | POA: Diagnosis not present

## 2022-03-10 DIAGNOSIS — L89313 Pressure ulcer of right buttock, stage 3: Secondary | ICD-10-CM | POA: Diagnosis not present

## 2022-03-10 DIAGNOSIS — G300 Alzheimer's disease with early onset: Secondary | ICD-10-CM | POA: Diagnosis not present

## 2022-03-10 DIAGNOSIS — F32A Depression, unspecified: Secondary | ICD-10-CM | POA: Diagnosis not present

## 2022-03-10 DIAGNOSIS — F02C3 Dementia in other diseases classified elsewhere, severe, with mood disturbance: Secondary | ICD-10-CM | POA: Diagnosis not present

## 2022-03-10 DIAGNOSIS — F02C4 Dementia in other diseases classified elsewhere, severe, with anxiety: Secondary | ICD-10-CM | POA: Diagnosis not present

## 2022-03-10 DIAGNOSIS — F02C18 Dementia in other diseases classified elsewhere, severe, with other behavioral disturbance: Secondary | ICD-10-CM | POA: Diagnosis not present

## 2022-03-10 DIAGNOSIS — R32 Unspecified urinary incontinence: Secondary | ICD-10-CM | POA: Diagnosis not present

## 2022-03-26 DIAGNOSIS — I10 Essential (primary) hypertension: Secondary | ICD-10-CM | POA: Diagnosis not present

## 2022-03-26 DIAGNOSIS — G300 Alzheimer's disease with early onset: Secondary | ICD-10-CM | POA: Diagnosis not present

## 2022-03-26 DIAGNOSIS — F02818 Dementia in other diseases classified elsewhere, unspecified severity, with other behavioral disturbance: Secondary | ICD-10-CM | POA: Diagnosis not present

## 2022-05-08 DIAGNOSIS — F02818 Dementia in other diseases classified elsewhere, unspecified severity, with other behavioral disturbance: Secondary | ICD-10-CM | POA: Diagnosis not present

## 2022-05-08 DIAGNOSIS — G300 Alzheimer's disease with early onset: Secondary | ICD-10-CM | POA: Diagnosis not present

## 2022-06-08 DIAGNOSIS — G300 Alzheimer's disease with early onset: Secondary | ICD-10-CM | POA: Diagnosis not present

## 2022-06-08 DIAGNOSIS — F02818 Dementia in other diseases classified elsewhere, unspecified severity, with other behavioral disturbance: Secondary | ICD-10-CM | POA: Diagnosis not present

## 2022-06-10 DIAGNOSIS — R109 Unspecified abdominal pain: Secondary | ICD-10-CM | POA: Diagnosis not present

## 2022-06-10 DIAGNOSIS — I10 Essential (primary) hypertension: Secondary | ICD-10-CM | POA: Diagnosis not present

## 2022-06-10 DIAGNOSIS — G9340 Encephalopathy, unspecified: Secondary | ICD-10-CM | POA: Diagnosis not present

## 2022-06-10 DIAGNOSIS — K5900 Constipation, unspecified: Secondary | ICD-10-CM | POA: Diagnosis not present

## 2022-06-10 DIAGNOSIS — F32A Depression, unspecified: Secondary | ICD-10-CM | POA: Diagnosis not present

## 2022-06-10 DIAGNOSIS — G300 Alzheimer's disease with early onset: Secondary | ICD-10-CM | POA: Diagnosis not present

## 2022-06-10 DIAGNOSIS — F02818 Dementia in other diseases classified elsewhere, unspecified severity, with other behavioral disturbance: Secondary | ICD-10-CM | POA: Diagnosis not present

## 2022-06-10 DIAGNOSIS — Z79899 Other long term (current) drug therapy: Secondary | ICD-10-CM | POA: Diagnosis not present

## 2022-06-10 DIAGNOSIS — E119 Type 2 diabetes mellitus without complications: Secondary | ICD-10-CM | POA: Diagnosis not present

## 2022-06-10 DIAGNOSIS — Z8616 Personal history of COVID-19: Secondary | ICD-10-CM | POA: Diagnosis not present

## 2022-06-30 DIAGNOSIS — R399 Unspecified symptoms and signs involving the genitourinary system: Secondary | ICD-10-CM | POA: Diagnosis not present

## 2022-06-30 DIAGNOSIS — F0280 Dementia in other diseases classified elsewhere without behavioral disturbance: Secondary | ICD-10-CM | POA: Diagnosis not present

## 2022-06-30 DIAGNOSIS — G301 Alzheimer's disease with late onset: Secondary | ICD-10-CM | POA: Diagnosis not present

## 2022-06-30 DIAGNOSIS — F0630 Mood disorder due to known physiological condition, unspecified: Secondary | ICD-10-CM | POA: Diagnosis not present

## 2022-07-02 DIAGNOSIS — M7989 Other specified soft tissue disorders: Secondary | ICD-10-CM | POA: Diagnosis not present

## 2022-07-02 DIAGNOSIS — R400 Somnolence: Secondary | ICD-10-CM | POA: Diagnosis not present

## 2022-07-02 DIAGNOSIS — G8929 Other chronic pain: Secondary | ICD-10-CM | POA: Diagnosis not present

## 2022-07-02 DIAGNOSIS — M1711 Unilateral primary osteoarthritis, right knee: Secondary | ICD-10-CM | POA: Diagnosis not present

## 2022-07-02 DIAGNOSIS — R531 Weakness: Secondary | ICD-10-CM | POA: Diagnosis not present

## 2022-07-02 DIAGNOSIS — M25561 Pain in right knee: Secondary | ICD-10-CM | POA: Diagnosis not present

## 2022-07-02 DIAGNOSIS — I10 Essential (primary) hypertension: Secondary | ICD-10-CM | POA: Diagnosis not present

## 2022-07-02 DIAGNOSIS — R410 Disorientation, unspecified: Secondary | ICD-10-CM | POA: Diagnosis not present

## 2022-07-04 DIAGNOSIS — R400 Somnolence: Secondary | ICD-10-CM | POA: Diagnosis not present

## 2022-07-07 DIAGNOSIS — G300 Alzheimer's disease with early onset: Secondary | ICD-10-CM | POA: Diagnosis not present

## 2022-07-07 DIAGNOSIS — F02818 Dementia in other diseases classified elsewhere, unspecified severity, with other behavioral disturbance: Secondary | ICD-10-CM | POA: Diagnosis not present

## 2022-07-17 DIAGNOSIS — R399 Unspecified symptoms and signs involving the genitourinary system: Secondary | ICD-10-CM | POA: Diagnosis not present
